# Patient Record
Sex: Female | Born: 1935
Health system: Southern US, Community
[De-identification: ages and names within clinical notes are randomized; demographics above are authoritative.]

## PROBLEM LIST (undated history)

## (undated) DIAGNOSIS — K8020 Calculus of gallbladder without cholecystitis without obstruction: Secondary | ICD-10-CM

## (undated) DIAGNOSIS — E538 Deficiency of other specified B group vitamins: Secondary | ICD-10-CM

## (undated) DIAGNOSIS — I10 Essential (primary) hypertension: Secondary | ICD-10-CM

## (undated) DIAGNOSIS — N390 Urinary tract infection, site not specified: Secondary | ICD-10-CM

## (undated) DIAGNOSIS — N183 Chronic kidney disease, stage 3 unspecified: Secondary | ICD-10-CM

## (undated) DIAGNOSIS — I6529 Occlusion and stenosis of unspecified carotid artery: Secondary | ICD-10-CM

## (undated) DIAGNOSIS — G459 Transient cerebral ischemic attack, unspecified: Secondary | ICD-10-CM

## (undated) DIAGNOSIS — F339 Major depressive disorder, recurrent, unspecified: Secondary | ICD-10-CM

## (undated) DIAGNOSIS — J189 Pneumonia, unspecified organism: Secondary | ICD-10-CM

## (undated) DIAGNOSIS — M1990 Unspecified osteoarthritis, unspecified site: Secondary | ICD-10-CM

## (undated) DIAGNOSIS — A0472 Enterocolitis due to Clostridium difficile, not specified as recurrent: Secondary | ICD-10-CM

## (undated) DIAGNOSIS — E785 Hyperlipidemia, unspecified: Secondary | ICD-10-CM

## (undated) DIAGNOSIS — K219 Gastro-esophageal reflux disease without esophagitis: Secondary | ICD-10-CM

## (undated) DIAGNOSIS — K635 Polyp of colon: Secondary | ICD-10-CM

## (undated) DIAGNOSIS — I129 Hypertensive chronic kidney disease with stage 1 through stage 4 chronic kidney disease, or unspecified chronic kidney disease: Secondary | ICD-10-CM

## (undated) DIAGNOSIS — E559 Vitamin D deficiency, unspecified: Secondary | ICD-10-CM

## (undated) DIAGNOSIS — M8000XA Age-related osteoporosis with current pathological fracture, unspecified site, initial encounter for fracture: Secondary | ICD-10-CM

## (undated) DIAGNOSIS — I739 Peripheral vascular disease, unspecified: Secondary | ICD-10-CM

## (undated) HISTORY — PX: CHOLECYSTECTOMY: SHX55

## (undated) HISTORY — PX: CATARACT EXTRACTION: SUR2

## (undated) HISTORY — DX: Hypertensive chronic kidney disease with stage 1 through stage 4 chronic kidney disease, or unspecified chronic kidney disease: I12.9

## (undated) HISTORY — DX: Urinary tract infection, site not specified: N39.0

## (undated) HISTORY — DX: Major depressive disorder, recurrent, unspecified: F33.9

## (undated) HISTORY — DX: Age-related osteoporosis with current pathological fracture, unspecified site, initial encounter for fracture: M80.00XA

## (undated) HISTORY — DX: Chronic kidney disease, stage 3 unspecified: N18.30

## (undated) HISTORY — DX: Polyp of colon: K63.5

## (undated) HISTORY — PX: ABDOMINAL HYSTERECTOMY: SHX81

## (undated) HISTORY — PX: APPENDECTOMY: SHX54

## (undated) HISTORY — DX: Peripheral vascular disease, unspecified: I73.9

## (undated) HISTORY — PX: BACK SURGERY: SHX140

## (undated) HISTORY — PX: TONSILLECTOMY: SUR1361

## (undated) HISTORY — DX: Calculus of gallbladder without cholecystitis without obstruction: K80.20

## (undated) HISTORY — DX: Hyperlipidemia, unspecified: E78.5

## (undated) HISTORY — DX: Unspecified osteoarthritis, unspecified site: M19.90

## (undated) HISTORY — DX: Deficiency of other specified B group vitamins: E53.8

## (undated) HISTORY — DX: Vitamin D deficiency, unspecified: E55.9

---
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
## 1993-01-07 HISTORY — PX: ABDOMINAL SURGERY: SHX537

## 2000-01-08 HISTORY — PX: CHOLECYSTECTOMY: SHX55

## 2000-01-08 HISTORY — PX: BACK SURGERY: SHX140

## 2000-05-29 ENCOUNTER — Other Ambulatory Visit: Admission: RE | Admit: 2000-05-29 | Discharge: 2000-05-29 | Payer: Self-pay | Admitting: Gynecology

## 2000-11-24 ENCOUNTER — Other Ambulatory Visit: Admission: RE | Admit: 2000-11-24 | Discharge: 2000-11-24 | Payer: Self-pay | Admitting: Gynecology

## 2001-06-16 ENCOUNTER — Other Ambulatory Visit: Admission: RE | Admit: 2001-06-16 | Discharge: 2001-06-16 | Payer: Self-pay | Admitting: Gynecology

## 2002-01-07 DIAGNOSIS — A0472 Enterocolitis due to Clostridium difficile, not specified as recurrent: Secondary | ICD-10-CM

## 2002-01-07 HISTORY — DX: Enterocolitis due to Clostridium difficile, not specified as recurrent: A04.72

## 2010-05-14 ENCOUNTER — Ambulatory Visit (HOSPITAL_COMMUNITY): Payer: Medicare Other

## 2010-05-14 ENCOUNTER — Ambulatory Visit (HOSPITAL_COMMUNITY)
Admission: RE | Admit: 2010-05-14 | Discharge: 2010-05-15 | Disposition: A | Discharge status: Home / Self Care | Payer: Medicare Other | Source: Ambulatory Visit | Attending: Orthopaedic Surgery | Admitting: Orthopaedic Surgery

## 2010-05-14 DIAGNOSIS — K219 Gastro-esophageal reflux disease without esophagitis: Secondary | ICD-10-CM | POA: Insufficient documentation

## 2010-05-14 DIAGNOSIS — I10 Essential (primary) hypertension: Secondary | ICD-10-CM | POA: Insufficient documentation

## 2010-05-14 DIAGNOSIS — Z01810 Encounter for preprocedural cardiovascular examination: Secondary | ICD-10-CM | POA: Insufficient documentation

## 2010-05-14 DIAGNOSIS — Z01812 Encounter for preprocedural laboratory examination: Secondary | ICD-10-CM | POA: Insufficient documentation

## 2010-05-14 DIAGNOSIS — Z01818 Encounter for other preprocedural examination: Secondary | ICD-10-CM | POA: Insufficient documentation

## 2010-05-14 DIAGNOSIS — Z79899 Other long term (current) drug therapy: Secondary | ICD-10-CM | POA: Insufficient documentation

## 2010-05-14 DIAGNOSIS — M5126 Other intervertebral disc displacement, lumbar region: Secondary | ICD-10-CM | POA: Insufficient documentation

## 2010-05-14 LAB — CBC
Hemoglobin: 12.3 g/dL (ref 12.0–15.0)
RBC: 4.17 MIL/uL (ref 3.87–5.11)

## 2010-05-14 LAB — SURGICAL PCR SCREEN: MRSA, PCR: NEGATIVE

## 2010-05-14 LAB — BASIC METABOLIC PANEL
CO2: 32 mEq/L (ref 19–32)
Chloride: 105 mEq/L (ref 96–112)
GFR calc Af Amer: 57 mL/min — ABNORMAL LOW (ref >60)
Sodium: 142 mEq/L (ref 135–145)

## 2010-06-07 NOTE — Op Note (Signed)
  [name], [name]              ACCOUNT NO.:  [id number]  MEDICAL RECORD NO.:  [id number]           PATIENT TYPE:  O  LOCATION:  5032                         FACILITY:  [facility]  PHYSICIAN:  Keaten Mashek C. Ophelia Charter, M.D.    DATE OF BIRTH:  July 13, 1935  DATE OF PROCEDURE:  05/14/2010 DATE OF DISCHARGE:                              OPERATIVE REPORT   PREOPERATIVE DIAGNOSIS:  Left L4-5 foraminal herniated nucleus pulposus.  POSTOPERATIVE DIAGNOSIS:  Left L4-5 foraminal herniated nucleus pulposus.  PROCEDURE:  Left L4-5 microdiskectomy.  SURGEON:  Zarriah Starkel C. Ophelia Charter, MD  ASSISTANT:  Patrick Jupiter RNFA  ESTIMATED BLOOD LOSS:  Minimal.  ANESTHESIA:  GOT plus 9 mL of Marcaine local.  PROCEDURE:  After induction of general anesthesia and orotracheal intubation, preoperative Ancef prophylaxis, intubation, the patient placed prone, careful padding, positioning arms at 90/90, shoulder pads anteriorly and pads over the knee.  Back was prepped with DuraPrep. Care was squared with towels, Betadine.  Steri-Drape applied and laminectomy sheet.  Time-out procedure was completed, needle localization cross-table lateral x-ray showed needle was barely above the 4-5 interspace, slightly cephalad, which was the position for the disk herniation.  An incision was made starting midline about 2 mm left midline centered over the needle area.  Subperiosteal dissection laterally was performed and foraminotomy was performed.  There were some thick chunks of ligaments which were removed.  The disk was bulging, noted laterally starting out at the level of the pedicle.  Annulus was incised with the nerve root gently protected with D'Errico and passes were made with up straight micro pituitary and down micro pituitary.  Using the coronary artery dilator, piece of the disk were teased and Epstein was used laterally underneath some lateral vein pushing down on the disk and then delivering it through the annular incision  by removing chunks of disk until it was decompressed.  Hockey stick could be passed out laterally. Bone was removed at the level of the pedicle, under surface of the facet was undercut getting more room.  Micro pituitary was used to grasp remaining fragments.  Hockey stick was passed anterior to the dura 180 degrees with no areas of compression.  Nerve root was free and copious irrigation was performed.  Repeat checking with Epsteins out laterally above the disk and then just underneath the annulus pushing some additional fragments down and then removing them with the micro pituitary.  Patrick Jupiter, RNFA assisted joint protecting the dura with the D'Errico.  Disk space was irrigated with saline solution. Hemostasis was obtained with bipolar cautery and in the lateral gutter and standard layered closure with 0 Vicryl in the deep fascia, 2-0 on the subcutaneous tissue, and 4-0 subcuticular skin closure.  Tincture of Benzoin and Steri-Strips, Marcaine infiltration with a 25-gauge needle, 4x4s and tape was applied.  Time-out procedure was closed at the end of the case procedure as posted.     Pansey Pinheiro C. Ophelia Charter, M.D.     MCY/MEDQ  D:  05/14/2010  T:  05/15/2010  Job:  [id number]  Electronically Signed by Annell Greening M.D. on 06/07/2010 06:27:14 PM

## 2013-03-13 ENCOUNTER — Emergency Department (HOSPITAL_COMMUNITY)
Admission: EM | Admit: 2013-03-13 | Discharge: 2013-03-13 | Disposition: A | Discharge status: Home / Self Care | Payer: Medicare Other | Attending: Emergency Medicine | Admitting: Emergency Medicine

## 2013-03-13 ENCOUNTER — Emergency Department (HOSPITAL_COMMUNITY): Payer: Medicare Other

## 2013-03-13 ENCOUNTER — Encounter (HOSPITAL_COMMUNITY): Payer: Self-pay | Admitting: Emergency Medicine

## 2013-03-13 DIAGNOSIS — K219 Gastro-esophageal reflux disease without esophagitis: Secondary | ICD-10-CM

## 2013-03-13 DIAGNOSIS — I10 Essential (primary) hypertension: Secondary | ICD-10-CM | POA: Insufficient documentation

## 2013-03-13 DIAGNOSIS — N289 Disorder of kidney and ureter, unspecified: Secondary | ICD-10-CM

## 2013-03-13 DIAGNOSIS — Z880 Allergy status to penicillin: Secondary | ICD-10-CM | POA: Insufficient documentation

## 2013-03-13 HISTORY — DX: Essential (primary) hypertension: I10

## 2013-03-13 LAB — COMPREHENSIVE METABOLIC PANEL
ALK PHOS: 50 U/L (ref 39–117)
ALT: 12 U/L (ref 0–35)
AST: 16 U/L (ref 0–37)
Albumin: 3.1 g/dL — ABNORMAL LOW (ref 3.5–5.2)
BUN: 33 mg/dL — AB (ref 6–23)
CALCIUM: 8.9 mg/dL (ref 8.4–10.5)
CO2: 24 meq/L (ref 19–32)
Chloride: 102 mEq/L (ref 96–112)
Creatinine, Ser: 2.02 mg/dL — ABNORMAL HIGH (ref 0.50–1.10)
GFR, EST AFRICAN AMERICAN: 27 mL/min — AB (ref >90)
GFR, EST NON AFRICAN AMERICAN: 23 mL/min — AB (ref >90)
GLUCOSE: 99 mg/dL (ref 70–99)
Potassium: 3.9 mEq/L (ref 3.7–5.3)
SODIUM: 141 meq/L (ref 137–147)
TOTAL PROTEIN: 6.3 g/dL (ref 6.0–8.3)
Total Bilirubin: 0.3 mg/dL (ref 0.3–1.2)

## 2013-03-13 LAB — CBC
HCT: 34 % — ABNORMAL LOW (ref 36.0–46.0)
Hemoglobin: 11.3 g/dL — ABNORMAL LOW (ref 12.0–15.0)
MCH: 30 pg (ref 26.0–34.0)
MCHC: 33.2 g/dL (ref 30.0–36.0)
MCV: 90.2 fL (ref 78.0–100.0)
PLATELETS: 116 10*3/uL — AB (ref 150–400)
RBC: 3.77 MIL/uL — ABNORMAL LOW (ref 3.87–5.11)
RDW: 14.4 % (ref 11.5–15.5)
WBC: 7.7 10*3/uL (ref 4.0–10.5)

## 2013-03-13 LAB — TROPONIN I: Troponin I: <0.3 ng/mL (ref <0.30)

## 2013-03-13 LAB — I-STAT TROPONIN, ED: TROPONIN I, POC: 0.01 ng/mL (ref 0.00–0.08)

## 2013-03-13 LAB — LIPASE, BLOOD: LIPASE: 49 U/L (ref 11–59)

## 2013-03-13 LAB — PRO B NATRIURETIC PEPTIDE: Pro B Natriuretic peptide (BNP): 317.5 pg/mL (ref 0–450)

## 2013-03-13 MED ORDER — FAMOTIDINE 20 MG PO TABS
40.0000 mg | ORAL_TABLET | Freq: Once | ORAL | Status: AC
  Administered 2013-03-13: 40 mg via ORAL
  Filled 2013-03-13: qty 2

## 2013-03-13 MED ORDER — GI COCKTAIL ~~LOC~~
30.0000 mL | Freq: Once | ORAL | Status: AC
Start: 2013-03-13 — End: 2013-03-13
  Administered 2013-03-13: 30 mL via ORAL
  Filled 2013-03-13: qty 30

## 2013-03-13 NOTE — ED Notes (Signed)
On primary assessment patient had audible gas sounds and belching.

## 2013-03-13 NOTE — Discharge Instructions (Signed)
Call your Dr. and tell him that your creatinine is 2 and this needs to be followed up on next week

## 2013-03-13 NOTE — ED Notes (Signed)
Patient took ASA and alka-seltzer prior to arrival. Patient stated this did give her relief.

## 2013-03-13 NOTE — ED Notes (Signed)
Pt is ambulatory. Discharged with family.

## 2013-03-13 NOTE — ED Provider Notes (Addendum)
CSN: [id number]     Arrival date & time 03/13/13  1931 History   First MD Initiated Contact with Patient 03/13/13 1956     Chief Complaint  Patient presents with  . Chest Pain     (Consider location/radiation/quality/duration/timing/severity/associated sxs/prior Treatment) Patient is a 78 y.o. female presenting with chest pain. The history is provided by the patient and a relative.  Chest Pain  patient here complaining of abdominal discomfort that began approximately 1 hour ago. Described as epigastric heaviness that radiated to her chest. Patient felt as if that she could burp she feel better. She took an antacid had very large belch and feels much better at this time. She has had issues with acid reflux in the past and over the last 2 nights has had increasing symptoms. She denies any anginal type chest pain. No diaphoresis or dyspnea. No leg pain or swelling. No syncope or near-syncope. Denies any prior history of coronary artery disease. She is currently asymptomatic at this time.  Past Medical History  Diagnosis Date  . Hypertension    Past Surgical History  Procedure Laterality Date  . Cholecystectomy    . Appendectomy    . Tonsillectomy     No family history on file. History  Substance Use Topics  . Smoking status: Never Smoker   . Smokeless tobacco: Not on file  . Alcohol Use: No   OB History   Grav Para Term Preterm Abortions TAB SAB Ect Mult Living                 Review of Systems  Cardiovascular: Positive for chest pain.  All other systems reviewed and are negative.      Allergies  Penicillins  Home Medications  No current outpatient prescriptions on file. There were no vitals taken for this visit. Physical Exam  Nursing note and vitals reviewed. Constitutional: She is oriented to person, place, and time. She appears well-developed and well-nourished.  Non-toxic appearance. No distress.  HENT:  Head: Normocephalic and atraumatic.  Eyes: Conjunctivae,  EOM and lids are normal. Pupils are equal, round, and reactive to light.  Neck: Normal range of motion. Neck supple. No tracheal deviation present. No mass present.  Cardiovascular: Normal rate, regular rhythm and normal heart sounds.  Exam reveals no gallop.   No murmur heard. Pulmonary/Chest: Effort normal and breath sounds normal. No stridor. No respiratory distress. She has no decreased breath sounds. She has no wheezes. She has no rhonchi. She has no rales.  Abdominal: Soft. Normal appearance and bowel sounds are normal. She exhibits no distension. There is no tenderness. There is no rebound and no CVA tenderness.  Musculoskeletal: Normal range of motion. She exhibits no edema and no tenderness.  Neurological: She is alert and oriented to person, place, and time. She has normal strength. No cranial nerve deficit or sensory deficit. GCS eye subscore is 4. GCS verbal subscore is 5. GCS motor subscore is 6.  Skin: Skin is warm and dry. No abrasion and no rash noted.  Psychiatric: She has a normal mood and affect. Her speech is normal and behavior is normal.    ED Course  Procedures (including critical care time) Labs Review Labs Reviewed  CBC  TROPONIN I  COMPREHENSIVE METABOLIC PANEL  LIPASE, BLOOD   Imaging Review No results found.   EKG Interpretation   Date/Time:  Saturday March 13 2013 19:37:21 EST Ventricular Rate:  66 PR Interval:  146 QRS Duration: 72 QT Interval:  436 QTC  Calculation: 457 R Axis:   51 Text Interpretation:  Sinus rhythm with Premature atrial complexes with  Abberant conduction Otherwise normal ECG No significant change since last  tracing Confirmed by Meyer Arora  MD, Trynity Skousen ([id number]) on 03/13/2013 8:07:58 PM      MDM   Final diagnoses:  None    Discussed with patient at length concerning her renal function. Patient states that her Dr. Has told her to avoid  all NSAIDs. Suspect that pt has chronic renal insufficiency. Patient will followup with her Dr.  next week concerning her renal function.    Leota Jacobsen, MD 03/13/13 3568  Leota Jacobsen, MD 03/13/13 [phone]

## 2013-03-13 NOTE — ED Notes (Signed)
Cp x 1 hr  With dizziness, shortness of breathe, and pain radiating into back and neck.  No hx of same.

## 2013-03-28 ENCOUNTER — Encounter (HOSPITAL_COMMUNITY): Payer: Self-pay | Admitting: Emergency Medicine

## 2013-03-28 ENCOUNTER — Emergency Department (HOSPITAL_COMMUNITY)
Admission: EM | Admit: 2013-03-28 | Discharge: 2013-03-29 | Disposition: A | Discharge status: Home / Self Care | Payer: Medicare Other | Attending: Emergency Medicine | Admitting: Emergency Medicine

## 2013-03-28 DIAGNOSIS — Z79899 Other long term (current) drug therapy: Secondary | ICD-10-CM | POA: Diagnosis not present

## 2013-03-28 DIAGNOSIS — Z7982 Long term (current) use of aspirin: Secondary | ICD-10-CM | POA: Diagnosis not present

## 2013-03-28 DIAGNOSIS — R112 Nausea with vomiting, unspecified: Secondary | ICD-10-CM | POA: Diagnosis not present

## 2013-03-28 DIAGNOSIS — I10 Essential (primary) hypertension: Secondary | ICD-10-CM | POA: Insufficient documentation

## 2013-03-28 DIAGNOSIS — R42 Dizziness and giddiness: Secondary | ICD-10-CM

## 2013-03-28 DIAGNOSIS — Z8673 Personal history of transient ischemic attack (TIA), and cerebral infarction without residual deficits: Secondary | ICD-10-CM | POA: Diagnosis not present

## 2013-03-28 DIAGNOSIS — Z880 Allergy status to penicillin: Secondary | ICD-10-CM | POA: Diagnosis not present

## 2013-03-28 HISTORY — DX: Occlusion and stenosis of unspecified carotid artery: I65.29

## 2013-03-28 HISTORY — DX: Transient cerebral ischemic attack, unspecified: G45.9

## 2013-03-28 LAB — CBC WITH DIFFERENTIAL/PLATELET
BASOS ABS: 0 10*3/uL (ref 0.0–0.1)
BASOS PCT: 1 % (ref 0–1)
Eosinophils Absolute: 0.1 10*3/uL (ref 0.0–0.7)
Eosinophils Relative: 2 % (ref 0–5)
HCT: 39.5 % (ref 36.0–46.0)
HEMOGLOBIN: 13.1 g/dL (ref 12.0–15.0)
LYMPHS PCT: 23 % (ref 12–46)
Lymphs Abs: 1.2 10*3/uL (ref 0.7–4.0)
MCH: 29.7 pg (ref 26.0–34.0)
MCHC: 33.2 g/dL (ref 30.0–36.0)
MCV: 89.6 fL (ref 78.0–100.0)
MONOS PCT: 9 % (ref 3–12)
Monocytes Absolute: 0.5 10*3/uL (ref 0.1–1.0)
NEUTROS PCT: 65 % (ref 43–77)
Neutro Abs: 3.3 10*3/uL (ref 1.7–7.7)
Platelets: 192 10*3/uL (ref 150–400)
RBC: 4.41 MIL/uL (ref 3.87–5.11)
RDW: 13.9 % (ref 11.5–15.5)
WBC: 5.2 10*3/uL (ref 4.0–10.5)

## 2013-03-28 LAB — URINALYSIS, ROUTINE W REFLEX MICROSCOPIC
Bilirubin Urine: NEGATIVE
Glucose, UA: NEGATIVE mg/dL
Hgb urine dipstick: NEGATIVE
Ketones, ur: NEGATIVE mg/dL
NITRITE: NEGATIVE
PH: 6.5 (ref 5.0–8.0)
Protein, ur: NEGATIVE mg/dL
SPECIFIC GRAVITY, URINE: 1.018 (ref 1.005–1.030)
Urobilinogen, UA: 1 mg/dL (ref 0.0–1.0)

## 2013-03-28 LAB — BASIC METABOLIC PANEL
BUN: 31 mg/dL — ABNORMAL HIGH (ref 6–23)
CHLORIDE: 101 meq/L (ref 96–112)
CO2: 25 mEq/L (ref 19–32)
Calcium: 9.6 mg/dL (ref 8.4–10.5)
Creatinine, Ser: 1.16 mg/dL — ABNORMAL HIGH (ref 0.50–1.10)
GFR calc Af Amer: 52 mL/min — ABNORMAL LOW (ref >90)
GFR calc non Af Amer: 45 mL/min — ABNORMAL LOW (ref >90)
Glucose, Bld: 106 mg/dL — ABNORMAL HIGH (ref 70–99)
POTASSIUM: 3.9 meq/L (ref 3.7–5.3)
Sodium: 139 mEq/L (ref 137–147)

## 2013-03-28 LAB — URINE MICROSCOPIC-ADD ON

## 2013-03-28 MED ORDER — ONDANSETRON 4 MG PO TBDP
8.0000 mg | ORAL_TABLET | Freq: Once | ORAL | Status: AC
  Administered 2013-03-28: 8 mg via ORAL
  Filled 2013-03-28: qty 2

## 2013-03-28 NOTE — ED Provider Notes (Addendum)
CSN: [id number]     Arrival date & time 03/28/13  1935 History   First MD Initiated Contact with Patient 03/28/13 2122     Chief Complaint  Patient presents with  . Hypertension  . Dizziness  . Headache  . Nausea  . Emesis     (Consider location/radiation/quality/duration/timing/severity/associated sxs/prior Treatment) Patient is a 78 y.o. female presenting with hypertension, dizziness, headaches, and vomiting. The history is provided by the patient.  Hypertension Associated symptoms include headaches.  Dizziness Associated symptoms: headaches and vomiting   Headache Associated symptoms: dizziness and vomiting   Emesis Associated symptoms: headaches    Carolyn [name] is a 78 y.o. female she presents for evaluation of hypertension. She has periods of high blood pressure, and weakness that is worse with standing, both of which are ongoing for several weeks. She was evaluated in an outlying ED, 4 days ago. Apparently, at that time, her blood pressure was "over 200 ". At that time an MRI of the head and neck were done and did not reveal any acute abnormalities. She has a list of blood pressures that were done today and yesterday, at home, which varied from approximately 170/92. A low of 107/60. She had some vomiting 4 days ago, but none since. She denies fever, chills, diarrhea, abdominal pain, back pain, persistent, weakness, persistent, dizziness, paresthesias, or inability to walk. She is in the ED, with 2 sons, who are very concerned about her health status. She states that she has some stress, but is not sure what it is. There are no other known modifying factors.   Past Medical History  Diagnosis Date  . Hypertension   . TIA (transient ischemic attack)     "not sure, mentioned in past and recently"  . Carotid artery narrowing    Past Surgical History  Procedure Laterality Date  . Cholecystectomy    . Appendectomy    . Tonsillectomy    . Back surgery     No family history  on file. History  Substance Use Topics  . Smoking status: Never Smoker   . Smokeless tobacco: Not on file  . Alcohol Use: No   OB History   Grav Para Term Preterm Abortions TAB SAB Ect Mult Living                 Review of Systems  Gastrointestinal: Positive for vomiting.  Neurological: Positive for dizziness and headaches.      Allergies  Penicillins  Home Medications   Current Outpatient Rx  Name  Route  Sig  Dispense  Refill  . aspirin EC 81 MG tablet   Oral   Take 81 mg by mouth daily.         . cloNIDine (CATAPRES) 0.1 MG tablet   Oral   Take 0.1 mg by mouth 2 (two) times daily.         Marland Kitchen dicyclomine (BENTYL) 10 MG capsule   Oral   Take 10 mg by mouth 4 (four) times daily as needed for spasms.          . fenofibrate micronized (LOFIBRA) 134 MG capsule   Oral   Take 134 mg by mouth daily before breakfast.         . hydrochlorothiazide (HYDRODIURIL) 25 MG tablet   Oral   Take 25 mg by mouth daily.         Marland Kitchen lisinopril (PRINIVIL,ZESTRIL) 10 MG tablet   Oral   Take 10 mg by mouth daily.         Marland Kitchen  omeprazole (PRILOSEC) 20 MG capsule   Oral   Take 20 mg by mouth daily.         . sodium-potassium bicarbonate (ALKA-SELTZER GOLD) TBEF dissolvable tablet   Oral   Take 2 tablets by mouth daily as needed (for indigestion).          BP 165/55  Pulse 55  Temp(Src) 98.1 F (36.7 C) (Oral)  Resp 16  Ht 5\' 3"  (1.6 m)  Wt 140 lb (63.504 kg)  BMI 24.81 kg/m2  SpO2 97% Physical Exam  Nursing note and vitals reviewed. Constitutional: She is oriented to person, place, and time. She appears well-developed.  Elderly, vigorous  HENT:  Head: Normocephalic and atraumatic.  Eyes: Conjunctivae and EOM are normal. Pupils are equal, round, and reactive to light.  Neck: Normal range of motion and phonation normal. Neck supple.  Cardiovascular: Normal rate, regular rhythm and intact distal pulses.   Pulmonary/Chest: Effort normal and breath sounds  normal. She exhibits no tenderness.  Abdominal: Soft. She exhibits no distension. There is no tenderness. There is no guarding.  Musculoskeletal: Normal range of motion.  Neurological: She is alert and oriented to person, place, and time. She exhibits normal muscle tone.  Skin: Skin is warm and dry.  Psychiatric: She has a normal mood and affect. Her behavior is normal. Judgment and thought content normal.    ED Course  Procedures (including critical care time)  BP at 22:25- 153/55   Medications  ondansetron (ZOFRAN-ODT) disintegrating tablet 8 mg (8 mg Oral Given 03/28/13 2013)    Patient Vitals for the past 24 hrs:  BP Temp Pulse Resp SpO2  03/28/13 2330 165/55 mmHg - 55 - 97 %  03/28/13 2300 167/64 mmHg - 58 - 96 %  03/28/13 2244 162/59 mmHg - 50 16 100 %  03/28/13 2230 153/53 mmHg - 57 - 95 %  03/28/13 2145 168/58 mmHg - 56 - 96 %  03/28/13 2122 - 98.1 F (36.7 C) - - -  03/28/13 2114 172/61 mmHg - 50 15 97 %    At D/C Reevaluation with update and discussion. After initial assessment and treatment, an updated evaluation reveals calm and comfortable with reassuring BP. Findings discussed with Pt. And Family, all questions answered.Daleen Bo L    Labs Review Labs Reviewed  BASIC METABOLIC PANEL - Abnormal; Notable for the following:    Glucose, Bld 106 (*)    BUN 31 (*)    Creatinine, Ser 1.16 (*)    GFR calc non Af Amer 45 (*)    GFR calc Af Amer 52 (*)    All other components within normal limits  URINALYSIS, ROUTINE W REFLEX MICROSCOPIC - Abnormal; Notable for the following:    Leukocytes, UA SMALL (*)    All other components within normal limits  URINE MICROSCOPIC-ADD ON - Abnormal; Notable for the following:    Bacteria, UA FEW (*)    Casts HYALINE CASTS (*)    All other components within normal limits  CBC WITH DIFFERENTIAL     MDM   Final diagnoses:  Hypertension  Dizziness   Mild HTN with nonspecific dizziness. Doubt hypertensive urgency, CVA,  metabolic instability or SBI.   Nursing Notes Reviewed/ Care Coordinated Applicable Imaging Reviewed Interpretation of Laboratory Data incorporated into ED treatment  The patient appears reasonably screened and/or stabilized for discharge and I doubt any other medical condition or other [facility] requiring further screening, evaluation, or treatment in the ED at this time prior to discharge.  Plan: Home Medications- Stop Clonadine; Home Treatments- rest; return here if the recommended treatment, does not improve the symptoms; Recommended follow up- PCP for check up in 3-4 days     Richarda Blade, MD 03/29/13 2104  Richarda Blade, MD 04/06/13 2351

## 2013-03-28 NOTE — Discharge Instructions (Signed)
Hypertension As your heart beats, it forces blood through your arteries. This force is your blood pressure. If the pressure is too high, it is called hypertension (HTN) or high blood pressure. HTN is dangerous because you may have it and not know it. High blood pressure may mean that your heart has to work harder to pump blood. Your arteries may be narrow or stiff. The extra work puts you at risk for heart disease, stroke, and other problems.  Blood pressure consists of two numbers, a higher number over a lower, 110/72, for example. It is stated as "110 over 72." The ideal is below 120 for the top number (systolic) and under 80 for the bottom (diastolic). Write down your blood pressure today. You should pay close attention to your blood pressure if you have certain conditions such as:  Heart failure.  Prior heart attack.  Diabetes  Chronic kidney disease.  Prior stroke.  Multiple risk factors for heart disease. To see if you have HTN, your blood pressure should be measured while you are seated with your arm held at the level of the heart. It should be measured at least twice. A one-time elevated blood pressure reading (especially in the Emergency Department) does not mean that you need treatment. There may be conditions in which the blood pressure is different between your right and left arms. It is important to see your caregiver soon for a recheck. Most people have essential hypertension which means that there is not a specific cause. This type of high blood pressure may be lowered by changing lifestyle factors such as:  Stress.  Smoking.  Lack of exercise.  Excessive weight.  Drug/tobacco/alcohol use.  Eating less salt. Most people do not have symptoms from high blood pressure until it has caused damage to the body. Effective treatment can often prevent, delay or reduce that damage. TREATMENT  When a cause has been identified, treatment for high blood pressure is directed at the  cause. There are a large number of medications to treat HTN. These fall into several categories, and your caregiver will help you select the medicines that are best for you. Medications may have side effects. You should review side effects with your caregiver. If your blood pressure stays high after you have made lifestyle changes or started on medicines,   Your medication(s) may need to be changed.  Other problems may need to be addressed.  Be certain you understand your prescriptions, and know how and when to take your medicine.  Be sure to follow up with your caregiver within the time frame advised (usually within two weeks) to have your blood pressure rechecked and to review your medications.  If you are taking more than one medicine to lower your blood pressure, make sure you know how and at what times they should be taken. Taking two medicines at the same time can result in blood pressure that is too low. SEEK IMMEDIATE MEDICAL CARE IF:  You develop a severe headache, blurred or changing vision, or confusion.  You have unusual weakness or numbness, or a faint feeling.  You have severe chest or abdominal pain, vomiting, or breathing problems. MAKE SURE YOU:   Understand these instructions.  Will watch your condition.  Will get help right away if you are not doing well or get worse. Document Released: 12/24/2004 Document Revised: 03/18/2011 Document Reviewed: 08/14/2007 [facility] Patient Information 2014 [place].  Vertigo Vertigo means you feel like you or your surroundings are moving when they are  not. Vertigo can be dangerous if it occurs when you are at work, driving, or performing difficult activities.  CAUSES  Vertigo occurs when there is a conflict of signals sent to your brain from the visual and sensory systems in your body. There are many different causes of vertigo, including:  Infections, especially in the inner ear.  A bad reaction to a drug or misuse of  alcohol and medicines.  Withdrawal from drugs or alcohol.  Rapidly changing positions, such as lying down or rolling over in bed.  A migraine headache.  Decreased blood flow to the brain.  Increased pressure in the brain from a head injury, infection, tumor, or bleeding. SYMPTOMS  You may feel as though the world is spinning around or you are falling to the ground. Because your balance is upset, vertigo can cause nausea and vomiting. You may have involuntary eye movements (nystagmus). DIAGNOSIS  Vertigo is usually diagnosed by physical exam. If the cause of your vertigo is unknown, your caregiver may perform imaging tests, such as an MRI scan (magnetic resonance imaging). TREATMENT  Most cases of vertigo resolve on their own, without treatment. Depending on the cause, your caregiver may prescribe certain medicines. If your vertigo is related to body position issues, your caregiver may recommend movements or procedures to correct the problem. In rare cases, if your vertigo is caused by certain inner ear problems, you may need surgery. HOME CARE INSTRUCTIONS   Follow your caregiver's instructions.  Avoid driving.  Avoid operating heavy machinery.  Avoid performing any tasks that would be dangerous to you or others during a vertigo episode.  Tell your caregiver if you notice that certain medicines seem to be causing your vertigo. Some of the medicines used to treat vertigo episodes can actually make them worse in some people. SEEK IMMEDIATE MEDICAL CARE IF:   Your medicines do not relieve your vertigo or are making it worse.  You develop problems with talking, walking, weakness, or using your arms, hands, or legs.  You develop severe headaches.  Your nausea or vomiting continues or gets worse.  You develop visual changes.  A family member notices behavioral changes.  Your condition gets worse. MAKE SURE YOU:  Understand these instructions.  Will watch your  condition.  Will get help right away if you are not doing well or get worse. Document Released: 10/03/2004 Document Revised: 03/18/2011 Document Reviewed: 07/12/2010 [facility] Patient Information 2014 [place].

## 2013-03-28 NOTE — ED Notes (Signed)
Pt very "wobbly" and cannot walk without holding on to something; pt got dizzy while ambulating and was returned back to room after approximately 50 feet

## 2013-03-28 NOTE — ED Notes (Signed)
Pt's son request to talk the MD, Dr. Eulis Foster made aware

## 2013-03-28 NOTE — ED Notes (Addendum)
C/o high BP. Also reports sx of: HA, dizziness, nv, some intermittent confusion when BP is really high, also "staggering". Sx ongoing for > 1 month. Sx fluctuate, but never go away. Relates sx to BP. Has been seen at [facility]. Has paperwork in hand. Recent MRIs. Mentions blockages in carotid arteries. D/c'd from ED on 3/19. Seen by PCP on 3/20. New BP meds started. Denies: CP or SOB. Pt had zofran, lisinopril, ativan and clonidine PTA. Some vomiting in triage. Family x2 with pt.

## 2013-03-29 NOTE — ED Notes (Signed)
Pt and family request from this RN to step out for the pt get dress, RN step out the room.

## 2014-04-06 DIAGNOSIS — Z0000 Encounter for general adult medical examination without abnormal findings: Secondary | ICD-10-CM | POA: Diagnosis not present

## 2014-04-06 DIAGNOSIS — G4700 Insomnia, unspecified: Secondary | ICD-10-CM | POA: Diagnosis not present

## 2014-04-06 DIAGNOSIS — I10 Essential (primary) hypertension: Secondary | ICD-10-CM | POA: Diagnosis not present

## 2014-04-06 DIAGNOSIS — Z23 Encounter for immunization: Secondary | ICD-10-CM | POA: Diagnosis not present

## 2014-04-06 DIAGNOSIS — N183 Chronic kidney disease, stage 3 (moderate): Secondary | ICD-10-CM | POA: Diagnosis not present

## 2014-04-06 DIAGNOSIS — E780 Pure hypercholesterolemia: Secondary | ICD-10-CM | POA: Diagnosis not present

## 2014-05-19 DIAGNOSIS — L578 Other skin changes due to chronic exposure to nonionizing radiation: Secondary | ICD-10-CM | POA: Diagnosis not present

## 2014-05-19 DIAGNOSIS — L570 Actinic keratosis: Secondary | ICD-10-CM | POA: Diagnosis not present

## 2014-05-19 DIAGNOSIS — C44729 Squamous cell carcinoma of skin of left lower limb, including hip: Secondary | ICD-10-CM | POA: Diagnosis not present

## 2014-05-24 DIAGNOSIS — C44729 Squamous cell carcinoma of skin of left lower limb, including hip: Secondary | ICD-10-CM | POA: Diagnosis not present

## 2014-05-31 DIAGNOSIS — L820 Inflamed seborrheic keratosis: Secondary | ICD-10-CM | POA: Diagnosis not present

## 2014-05-31 DIAGNOSIS — L570 Actinic keratosis: Secondary | ICD-10-CM | POA: Diagnosis not present

## 2014-05-31 DIAGNOSIS — L728 Other follicular cysts of the skin and subcutaneous tissue: Secondary | ICD-10-CM | POA: Diagnosis not present

## 2014-05-31 DIAGNOSIS — L821 Other seborrheic keratosis: Secondary | ICD-10-CM | POA: Diagnosis not present

## 2014-08-08 DIAGNOSIS — Z79899 Other long term (current) drug therapy: Secondary | ICD-10-CM | POA: Diagnosis not present

## 2014-08-08 DIAGNOSIS — D539 Nutritional anemia, unspecified: Secondary | ICD-10-CM | POA: Diagnosis not present

## 2014-08-08 DIAGNOSIS — E780 Pure hypercholesterolemia: Secondary | ICD-10-CM | POA: Diagnosis not present

## 2014-08-08 DIAGNOSIS — R5383 Other fatigue: Secondary | ICD-10-CM | POA: Diagnosis not present

## 2014-08-08 DIAGNOSIS — D649 Anemia, unspecified: Secondary | ICD-10-CM | POA: Diagnosis not present

## 2014-08-15 DIAGNOSIS — D510 Vitamin B12 deficiency anemia due to intrinsic factor deficiency: Secondary | ICD-10-CM | POA: Diagnosis not present

## 2014-08-22 DIAGNOSIS — R5383 Other fatigue: Secondary | ICD-10-CM | POA: Diagnosis not present

## 2014-08-29 DIAGNOSIS — D649 Anemia, unspecified: Secondary | ICD-10-CM | POA: Diagnosis not present

## 2014-09-06 DIAGNOSIS — R5383 Other fatigue: Secondary | ICD-10-CM | POA: Diagnosis not present

## 2014-10-11 DIAGNOSIS — L570 Actinic keratosis: Secondary | ICD-10-CM | POA: Diagnosis not present

## 2014-10-11 DIAGNOSIS — C44729 Squamous cell carcinoma of skin of left lower limb, including hip: Secondary | ICD-10-CM | POA: Diagnosis not present

## 2014-10-20 DIAGNOSIS — Z23 Encounter for immunization: Secondary | ICD-10-CM | POA: Diagnosis not present

## 2014-11-01 DIAGNOSIS — Z1231 Encounter for screening mammogram for malignant neoplasm of breast: Secondary | ICD-10-CM | POA: Diagnosis not present

## 2015-02-20 DIAGNOSIS — H43813 Vitreous degeneration, bilateral: Secondary | ICD-10-CM | POA: Diagnosis not present

## 2015-04-07 DIAGNOSIS — Z0000 Encounter for general adult medical examination without abnormal findings: Secondary | ICD-10-CM | POA: Diagnosis not present

## 2015-04-07 DIAGNOSIS — G4700 Insomnia, unspecified: Secondary | ICD-10-CM | POA: Diagnosis not present

## 2015-04-07 DIAGNOSIS — N183 Chronic kidney disease, stage 3 (moderate): Secondary | ICD-10-CM | POA: Diagnosis not present

## 2015-04-07 DIAGNOSIS — E785 Hyperlipidemia, unspecified: Secondary | ICD-10-CM | POA: Diagnosis not present

## 2015-04-07 DIAGNOSIS — I10 Essential (primary) hypertension: Secondary | ICD-10-CM | POA: Diagnosis not present

## 2015-04-07 DIAGNOSIS — Z79899 Other long term (current) drug therapy: Secondary | ICD-10-CM | POA: Diagnosis not present

## 2015-06-21 DIAGNOSIS — C44729 Squamous cell carcinoma of skin of left lower limb, including hip: Secondary | ICD-10-CM | POA: Diagnosis not present

## 2015-06-21 DIAGNOSIS — L821 Other seborrheic keratosis: Secondary | ICD-10-CM | POA: Diagnosis not present

## 2015-06-21 DIAGNOSIS — L570 Actinic keratosis: Secondary | ICD-10-CM | POA: Diagnosis not present

## 2015-06-21 DIAGNOSIS — L578 Other skin changes due to chronic exposure to nonionizing radiation: Secondary | ICD-10-CM | POA: Diagnosis not present

## 2015-07-12 DIAGNOSIS — I10 Essential (primary) hypertension: Secondary | ICD-10-CM | POA: Diagnosis not present

## 2015-07-12 DIAGNOSIS — I8390 Asymptomatic varicose veins of unspecified lower extremity: Secondary | ICD-10-CM | POA: Diagnosis not present

## 2015-08-21 DIAGNOSIS — C44729 Squamous cell carcinoma of skin of left lower limb, including hip: Secondary | ICD-10-CM | POA: Diagnosis not present

## 2015-08-21 DIAGNOSIS — L570 Actinic keratosis: Secondary | ICD-10-CM | POA: Diagnosis not present

## 2015-08-21 DIAGNOSIS — L820 Inflamed seborrheic keratosis: Secondary | ICD-10-CM | POA: Diagnosis not present

## 2015-08-28 DIAGNOSIS — I83892 Varicose veins of left lower extremities with other complications: Secondary | ICD-10-CM | POA: Diagnosis not present

## 2015-08-28 DIAGNOSIS — Z9181 History of falling: Secondary | ICD-10-CM | POA: Diagnosis not present

## 2015-09-07 ENCOUNTER — Other Ambulatory Visit: Payer: Self-pay | Admitting: Vascular Surgery

## 2015-09-13 DIAGNOSIS — I6521 Occlusion and stenosis of right carotid artery: Secondary | ICD-10-CM | POA: Diagnosis not present

## 2015-09-13 DIAGNOSIS — I10 Essential (primary) hypertension: Secondary | ICD-10-CM | POA: Diagnosis not present

## 2015-09-22 DIAGNOSIS — E785 Hyperlipidemia, unspecified: Secondary | ICD-10-CM | POA: Diagnosis not present

## 2015-09-22 DIAGNOSIS — Z23 Encounter for immunization: Secondary | ICD-10-CM | POA: Diagnosis not present

## 2015-09-22 DIAGNOSIS — Z0000 Encounter for general adult medical examination without abnormal findings: Secondary | ICD-10-CM | POA: Diagnosis not present

## 2015-09-22 DIAGNOSIS — Z79899 Other long term (current) drug therapy: Secondary | ICD-10-CM | POA: Diagnosis not present

## 2015-10-27 ENCOUNTER — Encounter: Payer: Self-pay | Admitting: Vascular Surgery

## 2015-10-31 ENCOUNTER — Ambulatory Visit (INDEPENDENT_AMBULATORY_CARE_PROVIDER_SITE_OTHER): Payer: Medicare Other | Admitting: Vascular Surgery

## 2015-10-31 ENCOUNTER — Encounter: Payer: Self-pay | Admitting: Vascular Surgery

## 2015-10-31 VITALS — BP 163/73 | HR 54 | Temp 97.1°F | Resp 14 | Ht 63.0 in | Wt 143.0 lb

## 2015-10-31 DIAGNOSIS — I83892 Varicose veins of left lower extremities with other complications: Secondary | ICD-10-CM | POA: Diagnosis not present

## 2015-10-31 HISTORY — DX: Varicose veins of left lower extremity with other complications: I83.892
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
## 2015-11-01 ENCOUNTER — Encounter: Payer: Self-pay | Admitting: Family Medicine

## 2015-11-03 DIAGNOSIS — Z1231 Encounter for screening mammogram for malignant neoplasm of breast: Secondary | ICD-10-CM | POA: Diagnosis not present

## 2015-11-17 ENCOUNTER — Other Ambulatory Visit: Payer: Self-pay | Admitting: *Deleted

## 2015-11-17 DIAGNOSIS — I8390 Asymptomatic varicose veins of unspecified lower extremity: Secondary | ICD-10-CM

## 2015-11-17 DIAGNOSIS — I6523 Occlusion and stenosis of bilateral carotid arteries: Secondary | ICD-10-CM

## 2015-11-17 DIAGNOSIS — I8392 Asymptomatic varicose veins of left lower extremity: Secondary | ICD-10-CM

## 2015-11-21 ENCOUNTER — Ambulatory Visit (INDEPENDENT_AMBULATORY_CARE_PROVIDER_SITE_OTHER)
Admission: RE | Admit: 2015-11-21 | Discharge: 2015-11-21 | Disposition: A | Discharge status: Home / Self Care | Payer: Medicare Other | Source: Ambulatory Visit | Attending: Vascular Surgery | Admitting: Vascular Surgery

## 2015-11-21 ENCOUNTER — Ambulatory Visit (HOSPITAL_COMMUNITY)
Admission: RE | Admit: 2015-11-21 | Discharge: 2015-11-21 | Disposition: A | Discharge status: Home / Self Care | Payer: Medicare Other | Source: Ambulatory Visit | Attending: Vascular Surgery | Admitting: Vascular Surgery

## 2015-11-21 DIAGNOSIS — I8390 Asymptomatic varicose veins of unspecified lower extremity: Secondary | ICD-10-CM | POA: Insufficient documentation

## 2015-11-21 DIAGNOSIS — I6523 Occlusion and stenosis of bilateral carotid arteries: Secondary | ICD-10-CM | POA: Diagnosis not present

## 2015-11-21 LAB — VAS US CAROTID
LCCAPSYS: 83 cm/s
LEFT ECA DIAS: -10 cm/s
LEFT VERTEBRAL DIAS: -11 cm/s
LICAPSYS: -96 cm/s
Left CCA dist dias: 16 cm/s
Left CCA dist sys: 75 cm/s
Left CCA prox dias: 14 cm/s
Left ICA prox dias: -18 cm/s
RCCAPDIAS: 6 cm/s
RIGHT CCA MID DIAS: 9 cm/s
RIGHT ECA DIAS: -14 cm/s
RIGHT VERTEBRAL DIAS: -9 cm/s
Right CCA prox sys: 65 cm/s

## 2015-11-22 ENCOUNTER — Encounter: Payer: Self-pay | Admitting: Vascular Surgery

## 2015-11-27 ENCOUNTER — Ambulatory Visit (INDEPENDENT_AMBULATORY_CARE_PROVIDER_SITE_OTHER): Payer: Medicare Other | Admitting: Vascular Surgery

## 2015-11-27 ENCOUNTER — Encounter: Payer: Self-pay | Admitting: Vascular Surgery

## 2015-11-27 VITALS — BP 146/65 | HR 57 | Temp 97.4°F | Resp 16 | Ht 64.0 in | Wt 145.0 lb

## 2015-11-27 DIAGNOSIS — I83892 Varicose veins of left lower extremities with other complications: Secondary | ICD-10-CM

## 2015-11-27 DIAGNOSIS — I6521 Occlusion and stenosis of right carotid artery: Secondary | ICD-10-CM | POA: Insufficient documentation

## 2015-11-27 HISTORY — DX: Occlusion and stenosis of right carotid artery: I65.21

## 2015-11-27 NOTE — Progress Notes (Signed)
Subjective:     Patient ID: Carolyn [name], female   DOB: December 22, 1935, 80 y.o.   MRN: [id number]  HPI This 80 year old female returns for continued follow-up regarding her varicose veins and pain in the left leg. She has tried long leg elastic compression stockings but continues to have aching and throbbing discomfort as well as swelling in the left ankle as the day progresses. Her other problem which was discovered at her last visit 3 weeks ago was an episode of transient blindness in the right eye as well as a high-pitched right carotid bruit on physical exam. She had a history of a previous study showing some mild to moderate carotid stenosis on the right a few years ago which had never been treated. She returns today for further follow-up regarding this problem as well. She denies any further neurologic symptoms such as other episodes of amaurosis fugax. She's had no transient weakness in the left arm leg and has had no speech abnormalities.  Past Medical History:  Diagnosis Date  . Carotid artery narrowing   . Hypertension   . TIA (transient ischemic attack)    "not sure, mentioned in past and recently"    Social History  Substance Use Topics  . Smoking status: Never Smoker  . Smokeless tobacco: Never Used  . Alcohol use No    No family history on file.  Allergies  Allergen Reactions  . Penicillins Hives     Current Outpatient Prescriptions:  .  aspirin EC 81 MG tablet, Take 81 mg by mouth daily., Disp: , Rfl:  .  cloNIDine (CATAPRES) 0.1 MG tablet, Take 0.1 mg by mouth 2 (two) times daily., Disp: , Rfl:  .  dicyclomine (BENTYL) 10 MG capsule, Take 10 mg by mouth 4 (four) times daily as needed for spasms. , Disp: , Rfl:  .  fenofibrate micronized (LOFIBRA) 134 MG capsule, Take 134 mg by mouth daily before breakfast., Disp: , Rfl:  .  hydrochlorothiazide (HYDRODIURIL) 25 MG tablet, Take 25 mg by mouth daily., Disp: , Rfl:  .  lisinopril (PRINIVIL,ZESTRIL) 10 MG tablet, Take 10  mg by mouth daily., Disp: , Rfl:  .  omeprazole (PRILOSEC) 20 MG capsule, Take 20 mg by mouth daily., Disp: , Rfl:  .  sodium-potassium bicarbonate (ALKA-SELTZER GOLD) TBEF dissolvable tablet, Take 2 tablets by mouth daily as needed (for indigestion)., Disp: , Rfl:   Vitals:   11/27/15 1425  BP: (!) 146/65  Pulse: (!) 57  Resp: 16  Temp: 97.4 F (36.3 C)  SpO2: 96%  Weight: 145 lb (65.8 kg)  Height: 5\' 4"  (1.626 m)    Body mass index is 24.89 kg/m.         Review of Systems   denies chest pain, dyspnea on exertion, PND, orthopnea, previous cardiac events, smoking, previous stroke.  Objective:   Physical Exam BP (!) 146/65 (BP Location: Left Arm, Patient Position: Sitting, Cuff Size: Normal)   Pulse (!) 57   Temp 97.4 F (36.3 C)   Resp 16   Ht 5\' 4"  (1.626 m)   Wt 145 lb (65.8 kg)   SpO2 96%   BMI 24.89 kg/m   Gen. well-developed well-nourished female no apparent distress alert and oriented 3 Right neck with 3+ carotid pulse palpable in high-pitched bruit Neurologic exam normal Left leg with bulging varicosities medial thigh and calf with 1+ distal edema and 2+ dorsalis pedis pulse palpable  Previous venous duplex exam performed 11/21/2015 reveals gross reflux in left great  saphenous vein as well as very severe right ICA stenosis on carotid duplex exam with velocities of 521/131 in the proximal right internal carotid     Assessment:     #1 amaurosis fugax right eye with high-grade right internal carotid stenosis #2 painful varicosities left leg with distal swelling due to gross reflux left great saphenous vein     Plan:     Patient will return tomorrow to see Dr. early to discuss right carotid endarterectomy She will return in early November 2018 for 3 month follow-up regarding her venous disease in the left leg Patient will continue to take daily aspirin and will notify us if she has any new neurologic symptoms prior to appointment tomorrow

## 2015-11-28 ENCOUNTER — Encounter: Payer: Self-pay | Admitting: Vascular Surgery

## 2015-11-28 ENCOUNTER — Other Ambulatory Visit: Payer: Self-pay

## 2015-11-28 ENCOUNTER — Ambulatory Visit (INDEPENDENT_AMBULATORY_CARE_PROVIDER_SITE_OTHER): Payer: BC Managed Care – PPO | Admitting: Vascular Surgery

## 2015-11-28 VITALS — BP 167/66 | HR 56 | Temp 97.8°F | Resp 18 | Ht 64.0 in | Wt 145.0 lb

## 2015-11-28 DIAGNOSIS — I6521 Occlusion and stenosis of right carotid artery: Secondary | ICD-10-CM

## 2015-11-28 NOTE — Progress Notes (Signed)
[facility] [place]  Patient name: Carolyn [name] MRN: [id number] DOB: 04-03-1935 Sex: female  REASON FOR VISIT: Discuss symptomatic right carotid stenosis  HPI: Carolyn [name] is a 80 y.o. female ear today for discussion of symptomatic right carotid stenosis. She's been seen by Dr. Kellie Simmering who obtained a carotid duplex. She had an episode several weeks ago very clear-cut total blindness in her right eye which was temporary and resolved completely. She is right-handed. She did not have any left body symptoms. Had no prior episodes of visual changes or weakness. She is quite healthy. She is here today with her daughter-in-law who is a Marine scientist at [employer]. No prior cardiac difficulties.  Past Medical History:  Diagnosis Date  . Carotid artery narrowing   . Hypertension   . TIA (transient ischemic attack)    "not sure, mentioned in past and recently"    History reviewed. No pertinent family history.  SOCIAL HISTORY: Social History  Substance Use Topics  . Smoking status: Never Smoker  . Smokeless tobacco: Never Used  . Alcohol use No    Allergies  Allergen Reactions  . Penicillins Hives    Current Outpatient Prescriptions  Medication Sig Dispense Refill  . aspirin EC 81 MG tablet Take 81 mg by mouth daily.    . cloNIDine (CATAPRES) 0.1 MG tablet Take 0.1 mg by mouth 2 (two) times daily.    Marland Kitchen dicyclomine (BENTYL) 10 MG capsule Take 10 mg by mouth 4 (four) times daily as needed for spasms.     . fenofibrate micronized (LOFIBRA) 134 MG capsule Take 134 mg by mouth daily before breakfast.    . hydrochlorothiazide (HYDRODIURIL) 25 MG tablet Take 25 mg by mouth daily.    Marland Kitchen lisinopril (PRINIVIL,ZESTRIL) 10 MG tablet Take 10 mg by mouth daily.    Marland Kitchen omeprazole (PRILOSEC) 20 MG capsule Take 20 mg by mouth daily.    . sodium-potassium bicarbonate (ALKA-SELTZER GOLD) TBEF dissolvable tablet Take 2 tablets by mouth daily  as needed (for indigestion).     No current facility-administered medications for this visit.     REVIEW OF SYSTEMS:  [X]  denotes positive finding, [ ]  denotes negative finding Cardiac  Comments:  Chest pain or chest pressure:    Shortness of breath upon exertion:    Short of breath when lying flat:    Irregular heart rhythm:        Vascular    Pain in calf, thigh, or hip brought on by ambulation:    Pain in feet at night that wakes you up from your sleep:     Blood clot in your veins:    Leg swelling:           PHYSICAL EXAM: Vitals:   11/28/15 0836 11/28/15 0838  BP: (!) 163/68 (!) 167/66  Pulse: (!) 56   Resp: 18   Temp: 97.8 F (36.6 C)   TempSrc: Oral   SpO2: 96%   Weight: 145 lb (65.8 kg)   Height: 5\' 4"  (1.626 m)     GENERAL: The patient is a well-nourished female, in no acute distress. The vital signs are documented above. CARDIOVASCULAR: Does have a harsh right carotid bruit and no bruit on the left. Carotid pulsation on the right is below the angle of the jaw. She does have 2+ radial pulses. PULMONARY: There is good air exchange  MUSCULOSKELETAL: There are no major deformities or cyanosis. NEUROLOGIC: No focal weakness or paresthesias are detected. SKIN:  There are no ulcers or rashes noted. PSYCHIATRIC: The patient has a normal affect.  DATA:  I reviewed her duplex showing a critical stenosis in her right internal carotid arterystenosis in her left carotid.  MEDICAL ISSUES: Had long discussion with the patient and her daughter-in-law. Explain the significance of her high-grade stenosis with probable resultant amaurosis fugax. Explain the significant risk of stroke with symptomatic right carotid stenosis. I have recommended right carotid endarterectomy for reduction of her stroke risk. I explained the procedure with a 1 night expected hospitalization. I did explain the 1-2% risk of stroke with surgery and also the very low risk of cranial nerve injury  associated with surgery. She understands and wishes to proceed we will schedule this within the next 1-2 weeks.    Rosetta Posner, MD FACS [facility] Tel [phone] Pager [phone]

## 2015-12-12 ENCOUNTER — Other Ambulatory Visit: Payer: Self-pay

## 2015-12-12 ENCOUNTER — Encounter (HOSPITAL_COMMUNITY): Payer: Self-pay

## 2015-12-12 ENCOUNTER — Encounter (HOSPITAL_COMMUNITY)
Admission: RE | Admit: 2015-12-12 | Discharge: 2015-12-12 | Disposition: A | Discharge status: Home / Self Care | Payer: Medicare Other | Source: Ambulatory Visit | Attending: Vascular Surgery | Admitting: Vascular Surgery

## 2015-12-12 ENCOUNTER — Encounter (HOSPITAL_COMMUNITY): Payer: Self-pay | Admitting: Anesthesiology

## 2015-12-12 DIAGNOSIS — I6523 Occlusion and stenosis of bilateral carotid arteries: Secondary | ICD-10-CM | POA: Insufficient documentation

## 2015-12-12 DIAGNOSIS — K219 Gastro-esophageal reflux disease without esophagitis: Secondary | ICD-10-CM | POA: Diagnosis not present

## 2015-12-12 DIAGNOSIS — I6521 Occlusion and stenosis of right carotid artery: Secondary | ICD-10-CM | POA: Diagnosis not present

## 2015-12-12 DIAGNOSIS — Z880 Allergy status to penicillin: Secondary | ICD-10-CM | POA: Diagnosis not present

## 2015-12-12 DIAGNOSIS — Z79899 Other long term (current) drug therapy: Secondary | ICD-10-CM | POA: Diagnosis not present

## 2015-12-12 DIAGNOSIS — Z01812 Encounter for preprocedural laboratory examination: Secondary | ICD-10-CM | POA: Insufficient documentation

## 2015-12-12 DIAGNOSIS — Z7982 Long term (current) use of aspirin: Secondary | ICD-10-CM | POA: Diagnosis not present

## 2015-12-12 DIAGNOSIS — I10 Essential (primary) hypertension: Secondary | ICD-10-CM | POA: Diagnosis not present

## 2015-12-12 DIAGNOSIS — Z8673 Personal history of transient ischemic attack (TIA), and cerebral infarction without residual deficits: Secondary | ICD-10-CM | POA: Diagnosis not present

## 2015-12-12 HISTORY — DX: Gastro-esophageal reflux disease without esophagitis: K21.9

## 2015-12-12 HISTORY — DX: Pneumonia, unspecified organism: J18.9

## 2015-12-12 HISTORY — DX: Enterocolitis due to Clostridium difficile, not specified as recurrent: A04.72

## 2015-12-12 LAB — PROTIME-INR
INR: 1.12
Prothrombin Time: 14.4 seconds (ref 11.4–15.2)

## 2015-12-12 LAB — URINALYSIS, ROUTINE W REFLEX MICROSCOPIC
Bacteria, UA: NONE SEEN
Bilirubin Urine: NEGATIVE
Glucose, UA: NEGATIVE mg/dL
Hgb urine dipstick: NEGATIVE
Ketones, ur: NEGATIVE mg/dL
Nitrite: NEGATIVE
PH: 6 (ref 5.0–8.0)
Protein, ur: NEGATIVE mg/dL
SPECIFIC GRAVITY, URINE: 1.017 (ref 1.005–1.030)

## 2015-12-12 LAB — COMPREHENSIVE METABOLIC PANEL
ALBUMIN: 3.8 g/dL (ref 3.5–5.0)
ALK PHOS: 51 U/L (ref 38–126)
ALT: 16 U/L (ref 14–54)
ANION GAP: 5 (ref 5–15)
AST: 22 U/L (ref 15–41)
BUN: 30 mg/dL — ABNORMAL HIGH (ref 6–20)
CALCIUM: 9.7 mg/dL (ref 8.9–10.3)
CO2: 30 mmol/L (ref 22–32)
Chloride: 106 mmol/L (ref 101–111)
Creatinine, Ser: 1.41 mg/dL — ABNORMAL HIGH (ref 0.44–1.00)
GFR calc Af Amer: 40 mL/min — ABNORMAL LOW (ref >60)
GFR calc non Af Amer: 35 mL/min — ABNORMAL LOW (ref >60)
GLUCOSE: 109 mg/dL — AB (ref 65–99)
POTASSIUM: 3.8 mmol/L (ref 3.5–5.1)
SODIUM: 141 mmol/L (ref 135–145)
Total Bilirubin: 0.6 mg/dL (ref 0.3–1.2)
Total Protein: 6.5 g/dL (ref 6.5–8.1)

## 2015-12-12 LAB — CBC
HCT: 39.5 % (ref 36.0–46.0)
HEMOGLOBIN: 12.9 g/dL (ref 12.0–15.0)
MCH: 29.7 pg (ref 26.0–34.0)
MCHC: 32.7 g/dL (ref 30.0–36.0)
MCV: 90.8 fL (ref 78.0–100.0)
Platelets: 170 10*3/uL (ref 150–400)
RBC: 4.35 MIL/uL (ref 3.87–5.11)
RDW: 14.1 % (ref 11.5–15.5)
WBC: 7.5 10*3/uL (ref 4.0–10.5)

## 2015-12-12 LAB — TYPE AND SCREEN
ABO/RH(D): O POS
ANTIBODY SCREEN: NEGATIVE

## 2015-12-12 LAB — SURGICAL PCR SCREEN
MRSA, PCR: NEGATIVE
Staphylococcus aureus: NEGATIVE

## 2015-12-12 LAB — ABO/RH: ABO/RH(D): O POS

## 2015-12-12 LAB — APTT: APTT: 29 s (ref 24–36)

## 2015-12-12 NOTE — Progress Notes (Signed)
Anesthesia Chart Review: Patient is a 80 year old female scheduled for right carotid endarterectomy on 12/13/15 by Dr. Donnetta Hutching. PAT was this afternoon. She had a recent episode of  Possible right eye amaurosis fugax around ~ 08/2015. Carotid U/S showed severe RICA stenosis.   History includes non-smoker, HTN, TIA, carotid artery stenosis, varicose veins, GERD, C. Difficile colitis '04, cholecystectomy, appendectomy, left L4-5 microdiscectomy '12, tonsillectomy, hysterectomy, abdominal surgery '95.   PCP is Dr. Bertram Millard with [facility].  She is not routinely followed by cardiology, but saw Dr. Agustin Cree approximately three years ago and reportedly had normal cardiac testing (she believes she had an echo and Holter). (Dr. Agustin Cree is now with [facility], but they did not have records on this patient. Prior to being [facility], that cardiology practice was with [facility] so records requested from their centralized medical records department, but records are still pending.)  Meds include ASA 81 mg, Bentyl, fenofibrate, lisinopril-HCTZ, melatonin, Prilosec, Zoloft.  BP (!) 163/74   Pulse 64   Temp 36.8 C (Oral)   Resp 20   Ht 5\' 4"  (1.626 m)   Wt 145 lb (65.8 kg)   SpO2 99%   BMI 24.89 kg/m    12/12/15 EKG: SB at 59 bpm.  By PCP notes (scanned under Media tab), she had a negative treadmill stress test in 1999. As above, [facility] records requested this afternoon; however, by 4:45 PM records had not been received and when I called ([phone]) message stated that centralized medical records closed at 4:30 PM.  11/21/15 Carotid U/S: Impression: 1. 80-99% RICA stenosis. 2. < [id number] LICA stenosis.  Preoperative labs noted. Cr 1.41. CBC WNL. Glucose 109.   PAT results appear acceptable. Further evaluation by her anesthesiologist on the day of surgery to discuss the definitive anesthesia plan. He/She can review any additional records, if  received.  George Hugh [facility]/Anesthesiology Phone [phone] 12/12/2015 4:52 PM

## 2015-12-12 NOTE — Progress Notes (Signed)
PCP:Dr. Humphrey Rolls @ [facility] in [place]  Cardiologist: none-- pt. States 3 yrs ago she saw Dr. Agustin Cree @ [facility] for increase blood pressure, requested echo/notes/other studies/holter monitor?.Stated everything was normal and has not been back since.

## 2015-12-12 NOTE — Pre-Procedure Instructions (Signed)
    Carolyn [name]  12/12/2015      [facility] [address], [place] [address] Phone: [phone] Fax: [phone]  [name] [address] [place] [address] Phone: [phone] Fax: [phone]    Your procedure is scheduled on Wed., Dec 6  Report to [facility] Admitting at 8:30 A.M.  Call this number if you have problems the morning of surgery:  [phone]   Remember:  Do not eat food or drink liquids after midnight.  Take these medicines the morning of surgery with A SIP OF WATER : aspirin, dicyclomine (bentyl) if needed, omeprazole (prilosec), sertraline (zoloft)              Stop: advil, motrin, ibuprofen, BC Powders, Goody's, vitamins/herbal medicines.   Do not wear jewelry, make-up or nail polish.  Do not wear lotions, powders, or perfumes, or deoderant.  Do not shave 48 hours prior to surgery.  Men may shave face and neck.  Do not bring valuables to the hospital.  [facility] is not responsible for any belongings or valuables.  Contacts, dentures or bridgework may not be worn into surgery.  Leave your suitcase in the car.  After surgery it may be brought to your room.  For patients admitted to the hospital, discharge time will be determined by your treatment team.  Patients discharged the day of surgery will not be allowed to drive home.    Special instructions:  Review preparing for surgery  Please read over the following fact sheets that you were given. Coughing and Deep Breathing and MRSA Information

## 2015-12-13 ENCOUNTER — Inpatient Hospital Stay (HOSPITAL_COMMUNITY)
Admission: RE | Admit: 2015-12-13 | Discharge: 2015-12-14 | DRG: 039 | Disposition: A | Discharge status: Home / Self Care | Payer: Medicare Other | Source: Ambulatory Visit | Attending: Vascular Surgery | Admitting: Vascular Surgery

## 2015-12-13 ENCOUNTER — Inpatient Hospital Stay (HOSPITAL_COMMUNITY): Payer: Medicare Other | Admitting: Vascular Surgery

## 2015-12-13 ENCOUNTER — Encounter (HOSPITAL_COMMUNITY)
Admission: RE | Disposition: A | Discharge status: Home / Self Care | Payer: Self-pay | Source: Ambulatory Visit | Attending: Vascular Surgery

## 2015-12-13 ENCOUNTER — Inpatient Hospital Stay (HOSPITAL_COMMUNITY): Payer: Medicare Other | Admitting: Anesthesiology

## 2015-12-13 DIAGNOSIS — K219 Gastro-esophageal reflux disease without esophagitis: Secondary | ICD-10-CM | POA: Diagnosis present

## 2015-12-13 DIAGNOSIS — I6521 Occlusion and stenosis of right carotid artery: Secondary | ICD-10-CM | POA: Diagnosis not present

## 2015-12-13 DIAGNOSIS — I6529 Occlusion and stenosis of unspecified carotid artery: Secondary | ICD-10-CM | POA: Diagnosis present

## 2015-12-13 DIAGNOSIS — Z8673 Personal history of transient ischemic attack (TIA), and cerebral infarction without residual deficits: Secondary | ICD-10-CM | POA: Diagnosis not present

## 2015-12-13 DIAGNOSIS — Z880 Allergy status to penicillin: Secondary | ICD-10-CM | POA: Diagnosis not present

## 2015-12-13 DIAGNOSIS — I10 Essential (primary) hypertension: Secondary | ICD-10-CM | POA: Diagnosis not present

## 2015-12-13 DIAGNOSIS — Z7982 Long term (current) use of aspirin: Secondary | ICD-10-CM

## 2015-12-13 DIAGNOSIS — Z79899 Other long term (current) drug therapy: Secondary | ICD-10-CM

## 2015-12-13 DIAGNOSIS — I83892 Varicose veins of left lower extremities with other complications: Secondary | ICD-10-CM | POA: Diagnosis not present

## 2015-12-13 HISTORY — PX: PATCH ANGIOPLASTY: SHX6230

## 2015-12-13 HISTORY — PX: ENDARTERECTOMY: SHX5162

## 2015-12-13 LAB — CBC
HCT: 36 % (ref 36.0–46.0)
Hemoglobin: 11.6 g/dL — ABNORMAL LOW (ref 12.0–15.0)
MCH: 29.3 pg (ref 26.0–34.0)
MCHC: 32.2 g/dL (ref 30.0–36.0)
MCV: 90.9 fL (ref 78.0–100.0)
PLATELETS: 132 10*3/uL — AB (ref 150–400)
RBC: 3.96 MIL/uL (ref 3.87–5.11)
RDW: 14.2 % (ref 11.5–15.5)
WBC: 7.6 10*3/uL (ref 4.0–10.5)

## 2015-12-13 LAB — CREATININE, SERUM
Creatinine, Ser: 1.12 mg/dL — ABNORMAL HIGH (ref 0.44–1.00)
GFR calc non Af Amer: 46 mL/min — ABNORMAL LOW (ref >60)
GFR, EST AFRICAN AMERICAN: 53 mL/min — AB (ref >60)

## 2015-12-13 SURGERY — ENDARTERECTOMY, CAROTID
Anesthesia: General | Site: Neck | Laterality: Right

## 2015-12-13 MED ORDER — GLYCOPYRROLATE 0.2 MG/ML IJ SOLN
INTRAMUSCULAR | Status: DC | PRN
  Administered 2015-12-13: 0.2 mg via INTRAVENOUS

## 2015-12-13 MED ORDER — ACETAMINOPHEN 325 MG RE SUPP: 325.0000 mg | RECTAL | Status: DC | PRN

## 2015-12-13 MED ORDER — FENTANYL CITRATE (PF) 100 MCG/2ML IJ SOLN
INTRAMUSCULAR | Status: DC | PRN
  Administered 2015-12-13 (×2): 100 ug via INTRAVENOUS

## 2015-12-13 MED ORDER — CHLORHEXIDINE GLUCONATE CLOTH 2 % EX PADS: 6.0000 | MEDICATED_PAD | Freq: Once | CUTANEOUS | Status: DC

## 2015-12-13 MED ORDER — POTASSIUM CHLORIDE CRYS ER 20 MEQ PO TBCR: 20.0000 meq | EXTENDED_RELEASE_TABLET | Freq: Every day | ORAL | Status: DC | PRN

## 2015-12-13 MED ORDER — SUGAMMADEX SODIUM 200 MG/2ML IV SOLN
INTRAVENOUS | Status: DC | PRN
  Administered 2015-12-13: 150 mg via INTRAVENOUS

## 2015-12-13 MED ORDER — ASPIRIN EC 81 MG PO TBEC
81.0000 mg | DELAYED_RELEASE_TABLET | Freq: Every day | ORAL | Status: DC
  Administered 2015-12-14: 81 mg via ORAL
  Filled 2015-12-13: qty 1

## 2015-12-13 MED ORDER — METOPROLOL TARTRATE 5 MG/5ML IV SOLN: 2.0000 mg | INTRAVENOUS | Status: DC | PRN

## 2015-12-13 MED ORDER — LIDOCAINE 2% (20 MG/ML) 5 ML SYRINGE
INTRAMUSCULAR | Status: AC
  Filled 2015-12-13: qty 5

## 2015-12-13 MED ORDER — ACETAMINOPHEN 325 MG PO TABS
325.0000 mg | ORAL_TABLET | ORAL | Status: DC | PRN
  Administered 2015-12-13: 650 mg via ORAL
  Filled 2015-12-13: qty 2

## 2015-12-13 MED ORDER — OXYCODONE-ACETAMINOPHEN 5-325 MG PO TABS
1.0000 | ORAL_TABLET | ORAL | Status: DC | PRN
  Administered 2015-12-14: 1 via ORAL
  Filled 2015-12-13: qty 1

## 2015-12-13 MED ORDER — PROPOFOL 10 MG/ML IV BOLUS
INTRAVENOUS | Status: AC
  Filled 2015-12-13: qty 20

## 2015-12-13 MED ORDER — 0.9 % SODIUM CHLORIDE (POUR BTL) OPTIME
TOPICAL | Status: DC | PRN
  Administered 2015-12-13: 2000 mL

## 2015-12-13 MED ORDER — FENTANYL CITRATE (PF) 100 MCG/2ML IJ SOLN
INTRAMUSCULAR | Status: AC
  Filled 2015-12-13: qty 2

## 2015-12-13 MED ORDER — PHENYLEPHRINE 40 MCG/ML (10ML) SYRINGE FOR IV PUSH (FOR BLOOD PRESSURE SUPPORT)
PREFILLED_SYRINGE | INTRAVENOUS | Status: AC
  Filled 2015-12-13: qty 10

## 2015-12-13 MED ORDER — LISINOPRIL-HYDROCHLOROTHIAZIDE 20-12.5 MG PO TABS: 1.0000 | ORAL_TABLET | Freq: Every day | ORAL | Status: DC

## 2015-12-13 MED ORDER — OXYCODONE HCL 5 MG PO TABS: 5.0000 mg | ORAL_TABLET | Freq: Once | ORAL | Status: DC | PRN

## 2015-12-13 MED ORDER — SODIUM CHLORIDE 0.9 % IV SOLN
INTRAVENOUS | Status: DC | PRN
  Administered 2015-12-13: 11:00:00 500 mL

## 2015-12-13 MED ORDER — VANCOMYCIN HCL IN DEXTROSE 1-5 GM/200ML-% IV SOLN
1000.0000 mg | INTRAVENOUS | Status: AC
  Administered 2015-12-13: 1000 mg via INTRAVENOUS
  Filled 2015-12-13: qty 200

## 2015-12-13 MED ORDER — BISACODYL 5 MG PO TBEC: 5.0000 mg | DELAYED_RELEASE_TABLET | Freq: Every day | ORAL | Status: DC | PRN

## 2015-12-13 MED ORDER — LABETALOL HCL 5 MG/ML IV SOLN
INTRAVENOUS | Status: DC | PRN
  Administered 2015-12-13: 10 mg via INTRAVENOUS

## 2015-12-13 MED ORDER — ONDANSETRON HCL 4 MG/2ML IJ SOLN
INTRAMUSCULAR | Status: AC
  Filled 2015-12-13: qty 2

## 2015-12-13 MED ORDER — LACTATED RINGERS IV SOLN
INTRAVENOUS | Status: DC | PRN
  Administered 2015-12-13 (×2): via INTRAVENOUS

## 2015-12-13 MED ORDER — LACTATED RINGERS IV SOLN
INTRAVENOUS | Status: DC
  Administered 2015-12-13: 09:00:00 via INTRAVENOUS

## 2015-12-13 MED ORDER — LISINOPRIL 20 MG PO TABS
20.0000 mg | ORAL_TABLET | Freq: Every day | ORAL | Status: DC
  Administered 2015-12-14: 20 mg via ORAL
  Filled 2015-12-13: qty 1

## 2015-12-13 MED ORDER — PROTAMINE SULFATE 10 MG/ML IV SOLN
INTRAVENOUS | Status: DC | PRN
  Administered 2015-12-13 (×2): 10 mg via INTRAVENOUS
  Administered 2015-12-13: 30 mg via INTRAVENOUS

## 2015-12-13 MED ORDER — LIDOCAINE HCL (PF) 1 % IJ SOLN
INTRAMUSCULAR | Status: AC
  Filled 2015-12-13: qty 30

## 2015-12-13 MED ORDER — GUAIFENESIN-DM 100-10 MG/5ML PO SYRP: 15.0000 mL | ORAL_SOLUTION | ORAL | Status: DC | PRN

## 2015-12-13 MED ORDER — ONDANSETRON HCL 4 MG/2ML IJ SOLN: 4.0000 mg | Freq: Four times a day (QID) | INTRAMUSCULAR | Status: DC | PRN

## 2015-12-13 MED ORDER — FENTANYL CITRATE (PF) 250 MCG/5ML IJ SOLN
INTRAMUSCULAR | Status: AC
  Filled 2015-12-13: qty 10

## 2015-12-13 MED ORDER — PANTOPRAZOLE SODIUM 40 MG PO TBEC
40.0000 mg | DELAYED_RELEASE_TABLET | Freq: Every day | ORAL | Status: DC
  Administered 2015-12-14: 40 mg via ORAL
  Filled 2015-12-13: qty 1

## 2015-12-13 MED ORDER — SERTRALINE HCL 50 MG PO TABS
50.0000 mg | ORAL_TABLET | Freq: Every day | ORAL | Status: DC
  Administered 2015-12-14: 50 mg via ORAL
  Filled 2015-12-13: qty 1

## 2015-12-13 MED ORDER — MAGNESIUM SULFATE 2 GM/50ML IV SOLN: 2.0000 g | Freq: Every day | INTRAVENOUS | Status: DC | PRN

## 2015-12-13 MED ORDER — PHENYLEPHRINE HCL 10 MG/ML IJ SOLN
INTRAVENOUS | Status: DC | PRN
  Administered 2015-12-13: 25 ug/min via INTRAVENOUS

## 2015-12-13 MED ORDER — GLYCOPYRROLATE 0.2 MG/ML IV SOSY
PREFILLED_SYRINGE | INTRAVENOUS | Status: AC
  Filled 2015-12-13: qty 3

## 2015-12-13 MED ORDER — MORPHINE SULFATE (PF) 2 MG/ML IV SOLN: 2.0000 mg | INTRAVENOUS | Status: DC | PRN

## 2015-12-13 MED ORDER — SUGAMMADEX SODIUM 200 MG/2ML IV SOLN
INTRAVENOUS | Status: AC
  Filled 2015-12-13: qty 2

## 2015-12-13 MED ORDER — FENOFIBRATE 160 MG PO TABS
160.0000 mg | ORAL_TABLET | Freq: Every day | ORAL | Status: DC
  Administered 2015-12-14: 160 mg via ORAL
  Filled 2015-12-13: qty 1

## 2015-12-13 MED ORDER — HYDRALAZINE HCL 20 MG/ML IJ SOLN
INTRAMUSCULAR | Status: AC
  Filled 2015-12-13: qty 1

## 2015-12-13 MED ORDER — LABETALOL HCL 5 MG/ML IV SOLN: 10.0000 mg | INTRAVENOUS | Status: DC | PRN

## 2015-12-13 MED ORDER — HEPARIN SODIUM (PORCINE) 1000 UNIT/ML IJ SOLN
INTRAMUSCULAR | Status: DC | PRN
  Administered 2015-12-13: 7000 [IU] via INTRAVENOUS

## 2015-12-13 MED ORDER — HYDROCHLOROTHIAZIDE 12.5 MG PO CAPS
12.5000 mg | ORAL_CAPSULE | Freq: Every day | ORAL | Status: DC
  Administered 2015-12-14: 12.5 mg via ORAL
  Filled 2015-12-13: qty 1

## 2015-12-13 MED ORDER — LISINOPRIL 20 MG PO TABS: 20.0000 mg | ORAL_TABLET | Freq: Every day | ORAL | Status: DC

## 2015-12-13 MED ORDER — LABETALOL HCL 5 MG/ML IV SOLN
INTRAVENOUS | Status: AC
  Filled 2015-12-13: qty 4

## 2015-12-13 MED ORDER — PHENOL 1.4 % MT LIQD: 1.0000 | OROMUCOSAL | Status: DC | PRN

## 2015-12-13 MED ORDER — OXYCODONE HCL 5 MG/5ML PO SOLN: 5.0000 mg | Freq: Once | ORAL | Status: DC | PRN

## 2015-12-13 MED ORDER — LISINOPRIL 5 MG PO TABS
5.0000 mg | ORAL_TABLET | Freq: Every day | ORAL | Status: DC
  Administered 2015-12-13: 5 mg via ORAL
  Filled 2015-12-13: qty 1

## 2015-12-13 MED ORDER — DICYCLOMINE HCL 10 MG PO CAPS: 10.0000 mg | ORAL_CAPSULE | Freq: Four times a day (QID) | ORAL | Status: DC | PRN

## 2015-12-13 MED ORDER — DOCUSATE SODIUM 100 MG PO CAPS
100.0000 mg | ORAL_CAPSULE | Freq: Every day | ORAL | Status: DC
  Administered 2015-12-14: 100 mg via ORAL
  Filled 2015-12-13: qty 1

## 2015-12-13 MED ORDER — LIDOCAINE HCL (CARDIAC) 20 MG/ML IV SOLN
INTRAVENOUS | Status: DC | PRN
  Administered 2015-12-13: 30 mg via INTRAVENOUS

## 2015-12-13 MED ORDER — PROPOFOL 10 MG/ML IV BOLUS
INTRAVENOUS | Status: DC | PRN
  Administered 2015-12-13: 150 mg via INTRAVENOUS

## 2015-12-13 MED ORDER — FENTANYL CITRATE (PF) 100 MCG/2ML IJ SOLN
25.0000 ug | INTRAMUSCULAR | Status: DC | PRN
  Administered 2015-12-13: 25 ug via INTRAVENOUS

## 2015-12-13 MED ORDER — VANCOMYCIN HCL IN DEXTROSE 1-5 GM/200ML-% IV SOLN
1000.0000 mg | Freq: Once | INTRAVENOUS | Status: AC
  Administered 2015-12-14: 1000 mg via INTRAVENOUS
  Filled 2015-12-13: qty 200

## 2015-12-13 MED ORDER — ENOXAPARIN SODIUM 40 MG/0.4ML ~~LOC~~ SOLN: 40.0000 mg | SUBCUTANEOUS | Status: DC

## 2015-12-13 MED ORDER — SENNOSIDES-DOCUSATE SODIUM 8.6-50 MG PO TABS: 1.0000 | ORAL_TABLET | Freq: Every evening | ORAL | Status: DC | PRN

## 2015-12-13 MED ORDER — SODIUM CHLORIDE 0.9 % IV SOLN
INTRAVENOUS | Status: DC
  Administered 2015-12-13: 19:00:00 via INTRAVENOUS

## 2015-12-13 MED ORDER — ROCURONIUM BROMIDE 100 MG/10ML IV SOLN
INTRAVENOUS | Status: DC | PRN
  Administered 2015-12-13: 50 mg via INTRAVENOUS
  Administered 2015-12-13: 10 mg via INTRAVENOUS

## 2015-12-13 MED ORDER — ONDANSETRON HCL 4 MG/2ML IJ SOLN
INTRAMUSCULAR | Status: DC | PRN
  Administered 2015-12-13: 4 mg via INTRAVENOUS

## 2015-12-13 MED ORDER — SODIUM CHLORIDE 0.9 % IV SOLN: 500.0000 mL | Freq: Once | INTRAVENOUS | Status: DC | PRN

## 2015-12-13 MED ORDER — MELATONIN 3 MG PO TABS
3.0000 mg | ORAL_TABLET | Freq: Every evening | ORAL | Status: DC | PRN
  Administered 2015-12-13: 3 mg via ORAL
  Filled 2015-12-13 (×3): qty 1

## 2015-12-13 MED ORDER — ROCURONIUM BROMIDE 10 MG/ML (PF) SYRINGE
PREFILLED_SYRINGE | INTRAVENOUS | Status: AC
  Filled 2015-12-13: qty 10

## 2015-12-13 MED ORDER — SODIUM CHLORIDE 0.9 % IV SOLN: INTRAVENOUS | Status: DC

## 2015-12-13 MED ORDER — HYDRALAZINE HCL 20 MG/ML IJ SOLN
5.0000 mg | INTRAMUSCULAR | Status: DC | PRN
  Administered 2015-12-13: 5 mg via INTRAVENOUS

## 2015-12-13 MED ORDER — ALUM & MAG HYDROXIDE-SIMETH 200-200-20 MG/5ML PO SUSP: 15.0000 mL | ORAL | Status: DC | PRN

## 2015-12-13 SURGICAL SUPPLY — 42 items
ADH SKN CLS APL DERMABOND .7 (GAUZE/BANDAGES/DRESSINGS) ×1
CANISTER SUCTION 2500CC (MISCELLANEOUS) ×3 IMPLANT
CANNULA VESSEL 3MM 2 BLNT TIP (CANNULA) ×6 IMPLANT
CATH ROBINSON RED A/P 18FR (CATHETERS) ×3 IMPLANT
CLIP LIGATING EXTRA MED SLVR (CLIP) ×3 IMPLANT
CLIP LIGATING EXTRA SM BLUE (MISCELLANEOUS) ×3 IMPLANT
CRADLE DONUT ADULT HEAD (MISCELLANEOUS) ×3 IMPLANT
DECANTER SPIKE VIAL GLASS SM (MISCELLANEOUS) IMPLANT
DERMABOND ADVANCED (GAUZE/BANDAGES/DRESSINGS) ×2
DERMABOND ADVANCED .7 DNX12 (GAUZE/BANDAGES/DRESSINGS) ×1 IMPLANT
DRAIN HEMOVAC 1/8 X 5 (WOUND CARE) IMPLANT
ELECT REM PT RETURN 9FT ADLT (ELECTROSURGICAL) ×3
ELECTRODE REM PT RTRN 9FT ADLT (ELECTROSURGICAL) ×1 IMPLANT
EVACUATOR SILICONE 100CC (DRAIN) IMPLANT
GLOVE BIOGEL PI IND STRL 6.5 (GLOVE) ×3 IMPLANT
GLOVE BIOGEL PI IND STRL 7.5 (GLOVE) ×1 IMPLANT
GLOVE BIOGEL PI INDICATOR 6.5 (GLOVE) ×6
GLOVE BIOGEL PI INDICATOR 7.5 (GLOVE) ×2
GLOVE ECLIPSE 6.5 STRL STRAW (GLOVE) ×6 IMPLANT
GLOVE ECLIPSE 7.0 STRL STRAW (GLOVE) ×3 IMPLANT
GLOVE SS BIOGEL STRL SZ 7.5 (GLOVE) ×1 IMPLANT
GLOVE SUPERSENSE BIOGEL SZ 7.5 (GLOVE) ×2
GOWN STRL REUS W/ TWL LRG LVL3 (GOWN DISPOSABLE) ×4 IMPLANT
GOWN STRL REUS W/TWL LRG LVL3 (GOWN DISPOSABLE) ×12
KIT BASIN OR (CUSTOM PROCEDURE TRAY) ×3 IMPLANT
KIT ROOM TURNOVER OR (KITS) ×3 IMPLANT
NEEDLE 22X1 1/2 (OR ONLY) (NEEDLE) IMPLANT
NS IRRIG 1000ML POUR BTL (IV SOLUTION) ×6 IMPLANT
PACK CAROTID (CUSTOM PROCEDURE TRAY) ×3 IMPLANT
PAD ARMBOARD 7.5X6 YLW CONV (MISCELLANEOUS) ×6 IMPLANT
PATCH HEMASHIELD 8X75 (Vascular Products) ×3 IMPLANT
SHUNT CAROTID BYPASS 10 (VASCULAR PRODUCTS) ×3 IMPLANT
SHUNT CAROTID BYPASS 12FRX15.5 (VASCULAR PRODUCTS) IMPLANT
SUT ETHILON 3 0 PS 1 (SUTURE) IMPLANT
SUT PROLENE 6 0 CC (SUTURE) ×6 IMPLANT
SUT SILK 3 0 (SUTURE)
SUT SILK 3-0 18XBRD TIE 12 (SUTURE) IMPLANT
SUT VIC AB 3-0 SH 27 (SUTURE) ×6
SUT VIC AB 3-0 SH 27X BRD (SUTURE) ×2 IMPLANT
SUT VICRYL 4-0 PS2 18IN ABS (SUTURE) ×3 IMPLANT
SYR CONTROL 10ML LL (SYRINGE) IMPLANT
WATER STERILE IRR 1000ML POUR (IV SOLUTION) ×3 IMPLANT

## 2015-12-13 NOTE — H&P (View-Only) (Signed)
[facility]ist of [place]  Patient name: NAYELIS CAFARELLI MRN: [id number] DOB: 03-Mar-1935 Sex: female  REASON FOR VISIT: Discuss symptomatic right carotid stenosis  HPI: Carolyn [name] is a 80 y.o. female ear today for discussion of symptomatic right carotid stenosis. She's been seen by Dr. Kellie Simmering who obtained a carotid duplex. She had an episode several weeks ago very clear-cut total blindness in her right eye which was temporary and resolved completely. She is right-handed. She did not have any left body symptoms. Had no prior episodes of visual changes or weakness. She is quite healthy. She is here today with her daughter-in-law who is a Marine scientist at [employer]. No prior cardiac difficulties.  Past Medical History:  Diagnosis Date  . Carotid artery narrowing   . Hypertension   . TIA (transient ischemic attack)    "not sure, mentioned in past and recently"    History reviewed. No pertinent family history.  SOCIAL HISTORY: Social History  Substance Use Topics  . Smoking status: Never Smoker  . Smokeless tobacco: Never Used  . Alcohol use No    Allergies  Allergen Reactions  . Penicillins Hives    Current Outpatient Prescriptions  Medication Sig Dispense Refill  . aspirin EC 81 MG tablet Take 81 mg by mouth daily.    . cloNIDine (CATAPRES) 0.1 MG tablet Take 0.1 mg by mouth 2 (two) times daily.    Marland Kitchen dicyclomine (BENTYL) 10 MG capsule Take 10 mg by mouth 4 (four) times daily as needed for spasms.     . fenofibrate micronized (LOFIBRA) 134 MG capsule Take 134 mg by mouth daily before breakfast.    . hydrochlorothiazide (HYDRODIURIL) 25 MG tablet Take 25 mg by mouth daily.    Marland Kitchen lisinopril (PRINIVIL,ZESTRIL) 10 MG tablet Take 10 mg by mouth daily.    Marland Kitchen omeprazole (PRILOSEC) 20 MG capsule Take 20 mg by mouth daily.    . sodium-potassium bicarbonate (ALKA-SELTZER GOLD) TBEF dissolvable tablet Take 2 tablets by mouth daily  as needed (for indigestion).     No current facility-administered medications for this visit.     REVIEW OF SYSTEMS:  [X]  denotes positive finding, [ ]  denotes negative finding Cardiac  Comments:  Chest pain or chest pressure:    Shortness of breath upon exertion:    Short of breath when lying flat:    Irregular heart rhythm:        Vascular    Pain in calf, thigh, or hip brought on by ambulation:    Pain in feet at night that wakes you up from your sleep:     Blood clot in your veins:    Leg swelling:           PHYSICAL EXAM: Vitals:   11/28/15 0836 11/28/15 0838  BP: (!) 163/68 (!) 167/66  Pulse: (!) 56   Resp: 18   Temp: 97.8 F (36.6 C)   TempSrc: Oral   SpO2: 96%   Weight: 145 lb (65.8 kg)   Height: 5\' 4"  (1.626 m)     GENERAL: The patient is a well-nourished female, in no acute distress. The vital signs are documented above. CARDIOVASCULAR: Does have a harsh right carotid bruit and no bruit on the left. Carotid pulsation on the right is below the angle of the jaw. She does have 2+ radial pulses. PULMONARY: There is good air exchange  MUSCULOSKELETAL: There are no major deformities or cyanosis. NEUROLOGIC: No focal weakness or paresthesias are detected. SKIN:  There are no ulcers or rashes noted. PSYCHIATRIC: The patient has a normal affect.  DATA:  I reviewed her duplex showing a critical stenosis in her right internal carotid arterystenosis in her left carotid.  MEDICAL ISSUES: Had long discussion with the patient and her daughter-in-law. Explain the significance of her high-grade stenosis with probable resultant amaurosis fugax. Explain the significant risk of stroke with symptomatic right carotid stenosis. I have recommended right carotid endarterectomy for reduction of her stroke risk. I explained the procedure with a 1 night expected hospitalization. I did explain the 1-2% risk of stroke with surgery and also the very low risk of cranial nerve injury  associated with surgery. She understands and wishes to proceed we will schedule this within the next 1-2 weeks.    Rosetta Posner, MD FACS [facility] Tel [phone] Pager [phone]

## 2015-12-13 NOTE — Anesthesia Preprocedure Evaluation (Signed)
Anesthesia Evaluation    Reviewed: Unable to perform ROS - Chart review only  Airway        Dental   Pulmonary           Cardiovascular hypertension,      Neuro/Psych    GI/Hepatic GERD  ,  Endo/Other    Renal/GU      Musculoskeletal   Abdominal   Peds  Hematology   Anesthesia Other Findings   Reproductive/Obstetrics                             Anesthesia Physical Anesthesia Plan Anesthesia Quick Evaluation

## 2015-12-13 NOTE — Transfer of Care (Signed)
Immediate Anesthesia Transfer of Care Note  Patient: Carolyn [name]  Procedure(s) Performed: Procedure(s): RIGHT CAROTID ENDARTERECTOMY (Right) PATCH ANGIOPLASTY USING HEMASHIELD PLATINUM FINESSE 0.3inx3in PATCH (Right)  Patient Location: PACU  Anesthesia Type:General  Level of Consciousness: awake, alert  and oriented  Airway & Oxygen Therapy: Patient Spontanous Breathing and Patient connected to nasal cannula oxygen  Post-op Assessment: Report given to RN and Post -op Vital signs reviewed and stable  Post vital signs: Reviewed and stable  Last Vitals:  Vitals:   12/13/15 0907  Pulse: (!) 54  Resp: 20  Temp: 36.6 C    Last Pain:  Vitals:   12/13/15 0907  TempSrc: Oral         Complications: No apparent anesthesia complications

## 2015-12-13 NOTE — Interval H&P Note (Signed)
History and Physical Interval Note:  12/13/2015 8:49 AM  Carolyn [name]  has presented today for surgery, with the diagnosis of Right carotid artery stenosis I65.21  The various methods of treatment have been discussed with the patient and family. After consideration of risks, benefits and other options for treatment, the patient has consented to  Procedure(s): ENDARTERECTOMY CAROTID (Right) as a surgical intervention .  The patient's history has been reviewed, patient examined, no change in status, stable for surgery.  I have reviewed the patient's chart and labs.  Questions were answered to the patient's satisfaction.     Curt Jews

## 2015-12-13 NOTE — Progress Notes (Signed)
PHARMACY NOTE:  ANTIMICROBIAL RENAL DOSAGE ADJUSTMENT  Current antimicrobial regimen includes a mismatch between antimicrobial dosage and estimated renal function.  As per policy approved by the Pharmacy & Therapeutics and Medical Executive Committees, the antimicrobial dosage will be adjusted accordingly.  Current antimicrobial dosage:  Vanc 1gm IV Q12H x 2 doses  Indication: surgical prophylaxis  Renal Function: CrCL 30 ml/min  Estimated Creatinine Clearance: 30.7 mL/min (by C-G formula based on SCr of 1.41 mg/dL (H)). []      On intermittent HD, scheduled: []      On CRRT    Antimicrobial dosage has been changed to:  Vancomycin 1gm IV x 1, give 24 hours post previous dose  Additional comments:  N/A   Thank you for allowing pharmacy to be a part of this patient's care.  Efren Kross D. Mina Marble, PharmD, BCPS Pager:  [phone] 12/13/2015, 6:42 PM

## 2015-12-13 NOTE — Anesthesia Postprocedure Evaluation (Signed)
Anesthesia Post Note  Patient: CHELSEI BEIDLEMAN  Procedure(s) Performed: Procedure(s) (LRB): RIGHT CAROTID ENDARTERECTOMY (Right) PATCH ANGIOPLASTY USING HEMASHIELD PLATINUM FINESSE 0.3inx3in PATCH (Right)  Patient location during evaluation: PACU Anesthesia Type: General Level of consciousness: awake Pain management: pain level controlled Vital Signs Assessment: post-procedure vital signs reviewed and stable Respiratory status: spontaneous breathing Cardiovascular status: stable Postop Assessment: no signs of nausea or vomiting Anesthetic complications: no    Last Vitals:  Vitals:   12/13/15 1703 12/13/15 1720  BP: (!) 125/49 (!) 137/47  Pulse: 63 68  Resp: 13 (!) 22  Temp:      Last Pain:  Vitals:   12/13/15 1720  TempSrc:   PainSc: Asleep                 Aymara Sassi

## 2015-12-13 NOTE — Op Note (Signed)
    OPERATIVE REPORT  DATE OF SURGERY: 12/13/2015  PATIENT: Carolyn [name], 80 y.o. female MRN: [id number]  DOB: 19-Jul-1935  PRE-OPERATIVE DIAGNOSIS: Symptomatic right carotid stenosis  POST-OPERATIVE DIAGNOSIS:  Same  PROCEDURE: Right carotid endarterectomy and Dacron patch angioplasty  SURGEON:  Curt Jews, M.D.  PHYSICIAN ASSISTANT: Dyanne Iha PA-C  ANESTHESIA:  Gen.  EBL: 200 ml  Total I/O In: 1300 [I.V.:1300] Out: 200 [Blood:200]  BLOOD ADMINISTERED: None  DRAINS: None  SPECIMEN: None  COUNTS CORRECT:  YES  PLAN OF CARE: PACU   PATIENT DISPOSITION:  PACU - hemodynamically stable  PROCEDURE DETAILS: Patient was taken to the operative placed supine position where the area of the right  Draped in usual sterile fashion. An incision was made anterior to the sternocleidomastoid and carried down through the platysma with cautery. The sternocleidomastoid reflected posteriorly and the carotid sheath was opened. The patient vein was ligated with 2-0 silk ties and divided. The vagus and hypoglossal nerves were identified and preserved. The common carotid artery was encircled with an umbilical tape and Rummel tourniquet. Dissection was continued onto the bifurcation. The superior thyroid artery was encircled with a 2-0 silk Potts tie. The external carotid was encircled with a blue vessel loop and the internal carotid was encircled with an umbilical tape and Rummel tourniquet. The patient was given 7000 units intravenous heparin. After adequate circulation time the internal/external and common carotid arteries were occluded. The common carotid artery was opened with 11 blade and sent longitudinally with Potts scissors. A 10 shunt was passed up the internal carotid and then allowed to back bleed and down the common carotid were secured with Rummel tourniquet. The endarterectomy was again on the common carotid artery and the plaque was divided proximally with Potts scissors. The  endarterectomy was controlled the bifurcation and external carotid was endarterectomized with an eversion technique and the internal carotid was endarterectomized in an open fashion. Remaining atheromatous debris was removed from the endarterectomy plane. A Finesse Hemashield Dacron patch was brought onto the field was sewn as a patch angioplasty with a running 6-0 Prolene suture. Prior to completion of the closure the shunt was removed in the usual flushing maneuvers were undertaken. Anastomosis completed and flow was restored first to the external and then the internal carotid artery. Excellent flow characteristics were noted with hand-held Doppler on the internal and external carotid arteries. The patient was given 50 mg of protamine to reverse the heparin. Wounds irrigated with saline. Hemostasis tablet cautery. Wounds were closed with 3-0 Vicryl in the subcutaneous and subcuticular tissue. Sterile dressing was applied the patient was transferred to the recovery room neurologically intact   Rosetta Posner, M.D., [facility] 12/13/2015 4:15 PM

## 2015-12-13 NOTE — Anesthesia Preprocedure Evaluation (Addendum)
Anesthesia Evaluation  Patient identified by MRN, date of birth, ID band Patient awake    Reviewed: Allergy & Precautions, NPO status , Patient's Chart, lab work & pertinent test results  History of Anesthesia Complications Negative for: history of anesthetic complications  Airway Mallampati: II  TM Distance: >3 FB Neck ROM: Full    Dental  (+) Teeth Intact   Pulmonary neg shortness of breath, neg sleep apnea, neg COPD, neg recent URI,    breath sounds clear to auscultation       Cardiovascular hypertension, Pt. on medications + Peripheral Vascular Disease   Rhythm:Regular     Neuro/Psych neg Seizures TIA   GI/Hepatic Neg liver ROS, GERD  Medicated and Controlled,  Endo/Other  negative endocrine ROS  Renal/GU Renal InsufficiencyRenal disease     Musculoskeletal   Abdominal   Peds  Hematology negative hematology ROS (+)   Anesthesia Other Findings   Reproductive/Obstetrics                            Anesthesia Physical Anesthesia Plan  ASA: III  Anesthesia Plan: General   Post-op Pain Management:    Induction: Intravenous  Airway Management Planned: Oral ETT  Additional Equipment: None  Intra-op Plan:   Post-operative Plan: Extubation in OR  Informed Consent: I have reviewed the patients History and Physical, chart, labs and discussed the procedure including the risks, benefits and alternatives for the proposed anesthesia with the patient or authorized representative who has indicated his/her understanding and acceptance.   Dental advisory given  Plan Discussed with: CRNA and Surgeon  Anesthesia Plan Comments:         Anesthesia Quick Evaluation

## 2015-12-13 NOTE — Progress Notes (Signed)
        S/P right CEA  Right radial pulse palpable  Right neck incision without hematoma No tongue deviation, smile is symmetric  Disposition stable   Ori Kreiter MAUREEN PA-C

## 2015-12-13 NOTE — Anesthesia Preprocedure Evaluation (Signed)
Anesthesia Evaluation  Patient identified by MRN, date of birth, ID band Patient awake    Airway        Dental   Pulmonary           Cardiovascular hypertension, + Peripheral Vascular Disease       Neuro/Psych TIA   GI/Hepatic   Endo/Other    Renal/GU      Musculoskeletal   Abdominal   Peds  Hematology   Anesthesia Other Findings   Reproductive/Obstetrics                             Anesthesia Physical Anesthesia Plan Anesthesia Quick Evaluation

## 2015-12-13 NOTE — Anesthesia Procedure Notes (Signed)
Procedure Name: Intubation Date/Time: 12/13/2015 11:46 AM Performed by: Eligha Bridegroom Pre-anesthesia Checklist: Patient identified, Emergency Drugs available, Patient being monitored, Suction available and Timeout performed Patient Re-evaluated:Patient Re-evaluated prior to inductionOxygen Delivery Method: Circle system utilized Preoxygenation: Pre-oxygenation with 100% oxygen Intubation Type: IV induction Ventilation: Mask ventilation without difficulty and Oral airway inserted - appropriate to patient size Laryngoscope Size: Mac and 3 Grade View: Grade II Tube type: Oral Number of attempts: 1 Placement Confirmation: ETT inserted through vocal cords under direct vision,  positive ETCO2 and breath sounds checked- equal and bilateral Secured at: 21 cm Tube secured with: Tape Dental Injury: Teeth and Oropharynx as per pre-operative assessment

## 2015-12-14 ENCOUNTER — Encounter (HOSPITAL_COMMUNITY): Payer: Self-pay | Admitting: Vascular Surgery

## 2015-12-14 LAB — BASIC METABOLIC PANEL
ANION GAP: 5 (ref 5–15)
BUN: 20 mg/dL (ref 6–20)
CHLORIDE: 108 mmol/L (ref 101–111)
CO2: 26 mmol/L (ref 22–32)
Calcium: 8.5 mg/dL — ABNORMAL LOW (ref 8.9–10.3)
Creatinine, Ser: 1.05 mg/dL — ABNORMAL HIGH (ref 0.44–1.00)
GFR calc Af Amer: 57 mL/min — ABNORMAL LOW (ref >60)
GFR calc non Af Amer: 50 mL/min — ABNORMAL LOW (ref >60)
Glucose, Bld: 89 mg/dL (ref 65–99)
POTASSIUM: 3.5 mmol/L (ref 3.5–5.1)
SODIUM: 139 mmol/L (ref 135–145)

## 2015-12-14 LAB — CBC
HCT: 33 % — ABNORMAL LOW (ref 36.0–46.0)
HEMOGLOBIN: 10.5 g/dL — AB (ref 12.0–15.0)
MCH: 28.9 pg (ref 26.0–34.0)
MCHC: 31.8 g/dL (ref 30.0–36.0)
MCV: 90.9 fL (ref 78.0–100.0)
Platelets: 119 10*3/uL — ABNORMAL LOW (ref 150–400)
RBC: 3.63 MIL/uL — AB (ref 3.87–5.11)
RDW: 14.5 % (ref 11.5–15.5)
WBC: 6.7 10*3/uL (ref 4.0–10.5)

## 2015-12-14 MED ORDER — OXYCODONE-ACETAMINOPHEN 5-325 MG PO TABS: 1.0000 | ORAL_TABLET | Freq: Four times a day (QID) | ORAL | 0 refills | Status: DC | PRN

## 2015-12-14 NOTE — Care Management Note (Signed)
Case Management Note  Patient Details  Name: OTHA CARLYLE MRN: [id number] Date of Birth: 04/22/1935  Subjective/Objective:     S/p R CEA , pta indep, for dc today, no needs.               Action/Plan:   Expected Discharge Date:  12/14/15               Expected Discharge Plan:  Home/Self Care  In-House Referral:     Discharge planning Services  CM Consult  Post Acute Care Choice:    Choice offered to:     DME Arranged:    DME Agency:     HH Arranged:    HH Agency:     Status of Service:  Completed, signed off  If discussed at [employer] of Stay Meetings, dates discussed:    Additional Comments:  Zenon Mayo, RN 12/14/2015, 10:59 AM

## 2015-12-14 NOTE — Progress Notes (Signed)
Patient ID: Carolyn [name], female   DOB: 1935-04-12, 80 y.o.   MRN: [id number] Doing well this morning. Mild soreness. Neurologically intact  Is. She has been walking in the room. Plan discharge today after breakfast. Follow-up in 2-3 weeks

## 2015-12-14 NOTE — Discharge Summary (Signed)
[facility] Discharge Summary   Patient ID:  Carolyn [name] MRN: [id number] DOB/AGE: [birth year] 80 y.o.  Admit date: 12/13/2015 Discharge date: 12/14/2015 Date of Surgery: 12/13/2015 Surgeon: Surgeon(s): Rosetta Posner, MD  Admission Diagnosis: Right carotid artery stenosis I65.21  Discharge Diagnoses:  Right carotid artery stenosis I65.21  Secondary Diagnoses: Past Medical History:  Diagnosis Date  . C. difficile diarrhea 2004  . Carotid artery narrowing   . GERD (gastroesophageal reflux disease)   . Hypertension   . Pneumonia    x3 times  . TIA (transient ischemic attack)    "not sure, mentioned in past and recently"    Procedure(s): RIGHT CAROTID ENDARTERECTOMY PATCH ANGIOPLASTY USING HEMASHIELD PLATINUM FINESSE 0.3inx3in PATCH  Discharged Condition: good  HPI: Carolyn [name] is a 80 y.o. female ear today for discussion of symptomatic right carotid stenosis. She's been seen by Dr. Kellie Simmering who obtained a carotid duplex. She had an episode several weeks ago very clear-cut total blindness in her right eye which was temporary and resolved completely. She is right-handed. She did not have any left body symptoms. Had no prior episodes of visual changes or weakness. She is quite healthy. She is here today with her daughter-in-law who is a Marine scientist at [employer]. No prior cardiac difficulties.   Hospital Course:  Carolyn [name] is a 80 y.o. female is S/P Procedure(s): RIGHT CAROTID ENDARTERECTOMY PATCH ANGIOPLASTY USING HEMASHIELD PLATINUM FINESSE 0.3inx3in PATCH  POD# 1 Tolerating PO's, ambulated, no tongue deviation , smile symmetric. Palpable radial pulse, and grip 5/5  Discharge home f/u with Dr. Donnetta Hutching in 2 weeks.  Consults:    Significant Diagnostic Studies: CBC Lab Results  Component Value Date   WBC 6.7 12/14/2015   HGB 10.5 (L) 12/14/2015   HCT 33.0 (L) 12/14/2015   MCV 90.9 12/14/2015   PLT 119 (L) 12/14/2015    BMET     Component Value Date/Time   NA 139 12/14/2015 0500   K 3.5 12/14/2015 0500   CL 108 12/14/2015 0500   CO2 26 12/14/2015 0500   GLUCOSE 89 12/14/2015 0500   BUN 20 12/14/2015 0500   CREATININE 1.05 (H) 12/14/2015 0500   CALCIUM 8.5 (L) 12/14/2015 0500   GFRNONAA 50 (L) 12/14/2015 0500   GFRAA 57 (L) 12/14/2015 0500   COAG Lab Results  Component Value Date   INR 1.12 12/12/2015     Disposition:  Discharge to :Home Discharge Instructions    Call MD for:  redness, tenderness, or signs of infection (pain, swelling, bleeding, redness, odor or green/yellow discharge around incision site)    Complete by:  As directed    Call MD for:  severe or increased pain, loss or decreased feeling  in affected limb(s)    Complete by:  As directed    Call MD for:  temperature >100.5    Complete by:  As directed    Discharge instructions    Complete by:  As directed    You may shower this evening.   Driving Restrictions    Complete by:  As directed    No driving for 1 week   Increase activity slowly    Complete by:  As directed    Walk with assistance use walker or cane as needed   Lifting restrictions    Complete by:  As directed    No heavy lifting for 4 weeks   Resume previous diet    Complete by:  As directed  Medication List    TAKE these medications   aspirin EC 81 MG tablet Take 81 mg by mouth daily.   B-12 5000 MCG Caps Take 1 tablet by mouth daily.   [employer] MELATONIN 5 MG Caps Generic drug:  Melatonin Take 5 mg by mouth at bedtime as needed (sleep).   dicyclomine 10 MG capsule Commonly known as:  BENTYL Take 10 mg by mouth 4 (four) times daily as needed for spasms.   fenofibrate 160 MG tablet Take 160 mg by mouth daily.   lisinopril 10 MG tablet Commonly known as:  PRINIVIL,ZESTRIL Take 10 mg by mouth daily.   lisinopril 5 MG tablet Commonly known as:  PRINIVIL,ZESTRIL Take 5 mg by mouth at bedtime. Takes Prinzide in the morning and just lisinopril at  bedtime   lisinopril-hydrochlorothiazide 20-12.5 MG tablet Commonly known as:  PRINZIDE,ZESTORETIC Take 1 tablet by mouth daily. Takes Prinzide in the morning and just lisinopril at bedtime   omeprazole 20 MG capsule Commonly known as:  PRILOSEC Take 20 mg by mouth daily as needed (heartburn).   oxyCODONE-acetaminophen 5-325 MG tablet Commonly known as:  PERCOCET/ROXICET Take 1 tablet by mouth every 6 (six) hours as needed for moderate pain.   sertraline 50 MG tablet Commonly known as:  ZOLOFT Take 50 mg by mouth daily.      Verbal and written Discharge instructions given to the patient. Wound care per Discharge AVS Follow-up Information    Early, Todd, MD Follow up in 2 week(s).   Specialties:  Vascular Surgery, Cardiology Why:  office will call Contact information: [place] [id number] [phone]           Signed: Laurence Slate [facility] 12/14/2015, 9:51 AM --- For VQI Registry use --- Instructions: Press F2 to tab through selections.  Delete question if not applicable.   Modified Rankin score at D/C (0-6): Rankin Score=0  IV medication needed for:  1. Hypertension: No 2. Hypotension: No  Post-op Complications: No  1. Post-op CVA or TIA: No  If yes: Event classification (right eye, left eye, right cortical, left cortical, verterobasilar, other):   If yes: Timing of event (intra-op, <6 hrs post-op, >=6 hrs post-op, unknown):   2. CN injury: No  If yes: CN  injuried   3. Myocardial infarction: No  If yes: Dx by (EKG or clinical, Troponin):   4.  CHF: No  5.  Dysrhythmia (new): No  6. Wound infection: No  7. Reperfusion symptoms: No  8. Return to OR: No  If yes: return to OR for (bleeding, neurologic, other CEA incision, other):   Discharge medications: Statin use:  No  for medical reason   ASA use:  Yes Beta blocker use:  No  for medical reason   ACE-Inhibitor use:  Yes P2Y12 Antagonist use: [ x] None, [ ]  Plavix, [ ]   Plasugrel, [ ]  Ticlopinine, [ ]  Ticagrelor, [ ]  Other, [ ]  No for medical reason, [ ]  Non-compliant, [ ]  Not-indicated Anti-coagulant use:  [ x] None, [ ]  Warfarin, [ ]  Rivaroxaban, [ ]  Dabigatran, [ ]  Other, [ ]  No for medical reason, [ ]  Non-compliant, [ ]  Not-indicated

## 2015-12-14 NOTE — Progress Notes (Signed)
Discussed discharge instructions and medications with patient and patients granddaughter. Both verbalized understanding with all questions answered. VSS. Handouts given on stroke and carotid education.  Pt discharged home with granddaughter.  [facility]

## 2015-12-19 ENCOUNTER — Encounter: Payer: Self-pay | Admitting: Vascular Surgery

## 2015-12-20 ENCOUNTER — Emergency Department (HOSPITAL_COMMUNITY): Payer: Medicare Other

## 2015-12-20 ENCOUNTER — Encounter: Payer: Self-pay | Admitting: Family

## 2015-12-20 ENCOUNTER — Telehealth: Payer: Self-pay | Admitting: Vascular Surgery

## 2015-12-20 ENCOUNTER — Encounter (HOSPITAL_COMMUNITY): Payer: Self-pay | Admitting: *Deleted

## 2015-12-20 ENCOUNTER — Emergency Department (HOSPITAL_COMMUNITY)
Admission: EM | Admit: 2015-12-20 | Discharge: 2015-12-20 | Disposition: A | Discharge status: Home / Self Care | Payer: Medicare Other | Attending: Emergency Medicine | Admitting: Emergency Medicine

## 2015-12-20 ENCOUNTER — Ambulatory Visit (INDEPENDENT_AMBULATORY_CARE_PROVIDER_SITE_OTHER): Payer: Medicare Other | Admitting: Family

## 2015-12-20 VITALS — BP 142/79 | HR 63 | Temp 97.3°F | Resp 20 | Wt 143.0 lb

## 2015-12-20 DIAGNOSIS — R51 Headache: Secondary | ICD-10-CM

## 2015-12-20 DIAGNOSIS — I10 Essential (primary) hypertension: Secondary | ICD-10-CM | POA: Insufficient documentation

## 2015-12-20 DIAGNOSIS — I6521 Occlusion and stenosis of right carotid artery: Secondary | ICD-10-CM

## 2015-12-20 DIAGNOSIS — G8918 Other acute postprocedural pain: Secondary | ICD-10-CM | POA: Diagnosis not present

## 2015-12-20 DIAGNOSIS — Z79899 Other long term (current) drug therapy: Secondary | ICD-10-CM | POA: Diagnosis not present

## 2015-12-20 DIAGNOSIS — Z7982 Long term (current) use of aspirin: Secondary | ICD-10-CM | POA: Insufficient documentation

## 2015-12-20 DIAGNOSIS — R519 Headache, unspecified: Secondary | ICD-10-CM

## 2015-12-20 DIAGNOSIS — Z8673 Personal history of transient ischemic attack (TIA), and cerebral infarction without residual deficits: Secondary | ICD-10-CM | POA: Insufficient documentation

## 2015-12-20 LAB — CBC WITH DIFFERENTIAL/PLATELET
BASOS ABS: 0.1 10*3/uL (ref 0.0–0.1)
BASOS PCT: 1 %
EOS ABS: 0.2 10*3/uL (ref 0.0–0.7)
Eosinophils Relative: 2 %
HEMATOCRIT: 35.9 % — AB (ref 36.0–46.0)
HEMOGLOBIN: 11.8 g/dL — AB (ref 12.0–15.0)
Lymphocytes Relative: 12 %
Lymphs Abs: 1.1 10*3/uL (ref 0.7–4.0)
MCH: 29.7 pg (ref 26.0–34.0)
MCHC: 32.9 g/dL (ref 30.0–36.0)
MCV: 90.4 fL (ref 78.0–100.0)
MONOS PCT: 8 %
Monocytes Absolute: 0.8 10*3/uL (ref 0.1–1.0)
NEUTROS ABS: 7.7 10*3/uL (ref 1.7–7.7)
NEUTROS PCT: 79 %
Platelets: 204 10*3/uL (ref 150–400)
RBC: 3.97 MIL/uL (ref 3.87–5.11)
RDW: 13.6 % (ref 11.5–15.5)
WBC: 9.8 10*3/uL (ref 4.0–10.5)

## 2015-12-20 LAB — BASIC METABOLIC PANEL
ANION GAP: 11 (ref 5–15)
BUN: 30 mg/dL — ABNORMAL HIGH (ref 6–20)
CALCIUM: 9.9 mg/dL (ref 8.9–10.3)
CHLORIDE: 99 mmol/L — AB (ref 101–111)
CO2: 26 mmol/L (ref 22–32)
CREATININE: 1.4 mg/dL — AB (ref 0.44–1.00)
GFR calc non Af Amer: 35 mL/min — ABNORMAL LOW (ref >60)
GFR, EST AFRICAN AMERICAN: 41 mL/min — AB (ref >60)
Glucose, Bld: 150 mg/dL — ABNORMAL HIGH (ref 65–99)
Potassium: 3.6 mmol/L (ref 3.5–5.1)
SODIUM: 136 mmol/L (ref 135–145)

## 2015-12-20 MED ORDER — METHOCARBAMOL 500 MG PO TABS: 500.0000 mg | ORAL_TABLET | Freq: Two times a day (BID) | ORAL | 0 refills | Status: DC

## 2015-12-20 MED ORDER — HYDROCODONE-ACETAMINOPHEN 5-325 MG PO TABS: 2.0000 | ORAL_TABLET | ORAL | 0 refills | Status: DC | PRN

## 2015-12-20 MED ORDER — FENTANYL CITRATE (PF) 100 MCG/2ML IJ SOLN
50.0000 ug | INTRAMUSCULAR | Status: AC | PRN
  Administered 2015-12-20 (×2): 50 ug via INTRAVENOUS
  Filled 2015-12-20: qty 2

## 2015-12-20 MED ORDER — SODIUM CHLORIDE 0.9 % IV BOLUS (SEPSIS)
1000.0000 mL | Freq: Once | INTRAVENOUS | Status: AC
  Administered 2015-12-20: 1000 mL via INTRAVENOUS

## 2015-12-20 MED ORDER — ONDANSETRON HCL 4 MG/2ML IJ SOLN
4.0000 mg | Freq: Once | INTRAMUSCULAR | Status: AC
  Administered 2015-12-20: 4 mg via INTRAVENOUS

## 2015-12-20 MED ORDER — METHOCARBAMOL 500 MG PO TABS
500.0000 mg | ORAL_TABLET | Freq: Once | ORAL | Status: AC
  Administered 2015-12-20: 500 mg via ORAL
  Filled 2015-12-20: qty 1

## 2015-12-20 MED ORDER — ONDANSETRON HCL 4 MG/2ML IJ SOLN
INTRAMUSCULAR | Status: AC
  Filled 2015-12-20: qty 2

## 2015-12-20 MED ORDER — DEXAMETHASONE 4 MG PO TABS: 4.0000 mg | ORAL_TABLET | Freq: Two times a day (BID) | ORAL | 0 refills | Status: DC

## 2015-12-20 NOTE — ED Triage Notes (Signed)
Pt had left carotid surgery last week, having left side headache since Monday. Pt went to md office today and given meds but pain has still increased. Reports n/v on Saturday and then pain increased following that. No neuro deficits are noted at this time, other than reports unsteady gait.

## 2015-12-20 NOTE — Telephone Encounter (Signed)
Spoke with pt granddaughter Ashly this morning.  Her grandmother continues to complain of pain radiating to the jaw and neck area.  She is currently out of pain medication.  Her carotid endarterectomy was about a week ago.  I discussed with her that our office opens in about 40 min and she should be able to be seen today or tomorrow at the latest if the pain is concerning to her.  Ruta Hinds, MD [facility] [place] Office: [phone] Pager: [phone]

## 2015-12-20 NOTE — Patient Instructions (Signed)
Stroke Prevention Some medical conditions and behaviors are associated with an increased chance of having a stroke. You may prevent a stroke by making healthy choices and managing medical conditions. How can I reduce my risk of having a stroke?  Stay physically active. Get at least 30 minutes of activity on most or all days.  Do not smoke. It may also be helpful to avoid exposure to secondhand smoke.  Limit alcohol use. Moderate alcohol use is considered to be:  No more than 2 drinks per day for men.  No more than 1 drink per day for nonpregnant women.  Eat healthy foods. This involves:  Eating 5 or more servings of fruits and vegetables a day.  Making dietary changes that address high blood pressure (hypertension), high cholesterol, diabetes, or obesity.  Manage your cholesterol levels.  Making food choices that are high in fiber and low in saturated fat, trans fat, and cholesterol may control cholesterol levels.  Take any prescribed medicines to control cholesterol as directed by your health care provider.  Manage your diabetes.  Controlling your carbohydrate and sugar intake is recommended to manage diabetes.  Take any prescribed medicines to control diabetes as directed by your health care provider.  Control your hypertension.  Making food choices that are low in salt (sodium), saturated fat, trans fat, and cholesterol is recommended to manage hypertension.  Ask your health care provider if you need treatment to lower your blood pressure. Take any prescribed medicines to control hypertension as directed by your health care provider.  If you are 18-39 years of age, have your blood pressure checked every 3-5 years. If you are 40 years of age or older, have your blood pressure checked every year.  Maintain a healthy weight.  Reducing calorie intake and making food choices that are low in sodium, saturated fat, trans fat, and cholesterol are recommended to manage  weight.  Stop drug abuse.  Avoid taking birth control pills.  Talk to your health care provider about the risks of taking birth control pills if you are over 35 years old, smoke, get migraines, or have ever had a blood clot.  Get evaluated for sleep disorders (sleep apnea).  Talk to your health care provider about getting a sleep evaluation if you snore a lot or have excessive sleepiness.  Take medicines only as directed by your health care provider.  For some people, aspirin or blood thinners (anticoagulants) are helpful in reducing the risk of forming abnormal blood clots that can lead to stroke. If you have the irregular heart rhythm of atrial fibrillation, you should be on a blood thinner unless there is a good reason you cannot take them.  Understand all your medicine instructions.  Make sure that other conditions (such as anemia or atherosclerosis) are addressed. Get help right away if:  You have sudden weakness or numbness of the face, arm, or leg, especially on one side of the body.  Your face or eyelid droops to one side.  You have sudden confusion.  You have trouble speaking (aphasia) or understanding.  You have sudden trouble seeing in one or both eyes.  You have sudden trouble walking.  You have dizziness.  You have a loss of balance or coordination.  You have a sudden, severe headache with no known cause.  You have new chest pain or an irregular heartbeat. Any of these symptoms may represent a serious problem that is an emergency. Do not wait to see if the symptoms will go away.   Get medical help at once. Call your local emergency services (911 in [place] Do not drive yourself to the hospital. This information is not intended to replace advice given to you by your health care provider. Make sure you discuss any questions you have with your health care provider. Document Released: 02/01/2004 Document Revised: 06/01/2015 Document Reviewed: 06/26/2012 Elsevier  Interactive Patient Education  2017 [employer]  
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
## 2015-12-20 NOTE — Progress Notes (Addendum)
Postoperative Visit   History of Present Illness  Carolyn [name] is a 80 y.o. female who is s/p right carotid endarterectomy with Dacron patch angioplasty on 12-13-15 by Dr. Donnetta Hutching.  She had an episode several weeks prior to the right CEA of very clear-cut total blindness in her right eye which was temporary and resolved completely. She is right-handed. She did not have any left body symptoms. Had no prior episodes of visual changes or weakness. She is quite healthy.  She returns today after Dr. Oneida Alar spoke with pt granddaughter Ashly this morning. The patient complains of pain radiating from both sides of her neck to the top of her head.  She is currently out of pain medication. She reports se has a little trouble swallowing, helps to drink fluids through a straw, does not seem to choke on her fluids, does not seem to have trouble swallowing solids.   She is scheduled for 2 weeks post op follow up with a PA on 12-26-15.  The patient's neck incision is healed.  The patient has had no stroke or TIA symptoms since the right CEA.  For VQI Use Only  PRE-ADM LIVING: Home  AMB STATUS: Ambulatory  Social History   Social History  . Marital status: Married    Spouse name: N/A  . Number of children: N/A  . Years of education: N/A   Occupational History  . Not on file.   Social History Main Topics  . Smoking status: Never Smoker  . Smokeless tobacco: Never Used  . Alcohol use No  . Drug use: No  . Sexual activity: Not on file   Other Topics Concern  . Not on file   Social History Narrative  . No narrative on file   Allergies  Allergen Reactions  . Penicillins Hives     Has patient had a PCN reaction causing immediate rash, facial/tongue/throat swelling, SOB or lightheadedness with hypotension:    # # YES # #  Has patient had a PCN reaction causing severe rash involving mucus membranes or skin necrosis: No Has patient had a PCN reaction that required hospitalization  No Has patient had a PCN reaction occurring within the last 10 years: No If all of the above answers are "NO", then may proceed with Cephalosporin use.      Current Outpatient Prescriptions on File Prior to Visit  Medication Sig Dispense Refill  . aspirin EC 81 MG tablet Take 81 mg by mouth daily.    . Cyanocobalamin (B-12) 5000 MCG CAPS Take 1 tablet by mouth daily.    Marland Kitchen dicyclomine (BENTYL) 10 MG capsule Take 10 mg by mouth 4 (four) times daily as needed for spasms.     . fenofibrate 160 MG tablet Take 160 mg by mouth daily.    Marland Kitchen lisinopril (PRINIVIL,ZESTRIL) 10 MG tablet Take 10 mg by mouth daily.    Marland Kitchen lisinopril (PRINIVIL,ZESTRIL) 5 MG tablet Take 5 mg by mouth at bedtime. Takes Prinzide in the morning and just lisinopril at bedtime    . lisinopril-hydrochlorothiazide (PRINZIDE,ZESTORETIC) 20-12.5 MG tablet Take 1 tablet by mouth daily. Takes Prinzide in the morning and just lisinopril at bedtime    . Melatonin (CVS MELATONIN) 5 MG CAPS Take 5 mg by mouth at bedtime as needed (sleep).    Marland Kitchen omeprazole (PRILOSEC) 20 MG capsule Take 20 mg by mouth daily as needed (heartburn).     Marland Kitchen oxyCODONE-acetaminophen (PERCOCET/ROXICET) 5-325 MG tablet Take 1 tablet by mouth every 6 (six)  hours as needed for moderate pain. 6 tablet 0  . sertraline (ZOLOFT) 50 MG tablet Take 50 mg by mouth daily.     No current facility-administered medications on file prior to visit.     Physical Examination  Vitals:   12/20/15 1514 12/20/15 1520  BP: (!) 149/72 (!) 142/79  Pulse: 64 63  Resp: 20   Temp: 97.3 F (36.3 C)   SpO2: 94%   Weight: 143 lb (64.9 kg)    Body mass index is 24.55 kg/m.  Right side Neck: Incision is healed Neuro: CN 2-12 are intact, Motor strength is 5/5 bilaterally, sensation is grossly intact  Medical Decision Making  Carolyn [name] is a 80 y.o. female who presents s/p right CEA on 12-13-15. She had a preoperative right occular stroke, no stroke or TIA subsequently.  I  discussed her bilateral neck pain and headaches with Dr. Scot Dock, dexamethasone 4 mg po every 6 hours x 4 doses only for post perfusion headaches. I advised pt to call us by Friday morning, 12-22-15, let us know how she is doing. I also advised if the headache gets worse to get to an ED.  If headache does not improve will consider CT of head.  She is scheduled for 2 weeks post op follow up with a PA on 12-26-15, Dr. Donnetta Hutching is in the office this date, advised to keep this appointment.  Her grand-daughter called after they left the office, stated her headache is such that the grand-daughter will take her to the ED.  The patient is currently on an antiplatelet: 81 mg ASA. The patient is currently not on a statin.  Thank you for allowing [place] to participate in this patient's care.  Kaelin Holford, Sharmon Leyden, RN, MSN, FNP-C [facility] Office: [phone]  12/20/2015, 3:20 PM  Clinic MD: Scot Dock

## 2015-12-20 NOTE — ED Notes (Signed)
Patient transported to CT 

## 2015-12-26 ENCOUNTER — Encounter: Payer: Self-pay | Admitting: Vascular Surgery

## 2015-12-26 ENCOUNTER — Ambulatory Visit (INDEPENDENT_AMBULATORY_CARE_PROVIDER_SITE_OTHER): Payer: Medicare Other | Admitting: Vascular Surgery

## 2015-12-26 VITALS — BP 129/72 | HR 61 | Temp 99.0°F | Resp 16 | Ht 64.0 in | Wt 145.6 lb

## 2015-12-26 DIAGNOSIS — I6521 Occlusion and stenosis of right carotid artery: Secondary | ICD-10-CM

## 2015-12-26 NOTE — Progress Notes (Signed)
Patient name: Carolyn [name] MRN: [id number] DOB: [birth year] Sex: female  REASON FOR VISIT: post-op  HPI: Carolyn [name] is a 80 y.o. female who presents for postoperative follow-up status post right carotid endarterectomy for symptomatic right carotid stenosis on 12/13/2015. The patient had an episode of total blindness in her right eye which resolved completely. She denies any amaurosis fugax, sudden onset weakness or numbness in the extremities expressive aphasia.  She was seen our office on 12/20/15 by [name] secondary to pain radiating from both sides of her neck to the top of her head. She reported that she was out of pain medication. Dr. Scot Dock was present in the office at that time and recommended dexamethasone 4 mg every 6 hours 4 doses for post perfusion headaches. Later that day, her granddaughter called stating that her headache persisted and went to the emergency department.  Her CT of head was without acute abnormality. Her neuro exam was intact. Her symptoms were felt to be consistent with musculoskeletal etiology.  Today, the pain at her posterior right head is very mild. She denies any neck pain. She denies any issues with swallowing.  She is on aspirin and fenofibrate daily.  Current Outpatient Prescriptions  Medication Sig Dispense Refill  . aspirin EC 81 MG tablet Take 81 mg by mouth daily.    Marland Kitchen dexamethasone (DECADRON) 4 MG tablet Take 1 tablet (4 mg total) by mouth 2 (two) times daily with a meal. Take 1 tablet by mouth ever 6 hours for a total of four doses 4 tablet 0  . dicyclomine (BENTYL) 10 MG capsule Take 10 mg by mouth 4 (four) times daily as needed for spasms.     . fenofibrate 160 MG tablet Take 160 mg by mouth daily.    Marland Kitchen HYDROcodone-acetaminophen (NORCO/VICODIN) 5-325 MG tablet Take 2 tablets by mouth every 4 (four) hours as needed. 10 tablet 0  . lisinopril (PRINIVIL,ZESTRIL) 5 MG tablet Take 5 mg by mouth at bedtime. Takes Prinzide in the  morning and just lisinopril at bedtime    . lisinopril-hydrochlorothiazide (PRINZIDE,ZESTORETIC) 20-12.5 MG tablet Take 1 tablet by mouth daily. Takes Prinzide in the morning and just lisinopril at bedtime    . Melatonin (CVS MELATONIN) 5 MG CAPS Take 5 mg by mouth at bedtime as needed (sleep).    . methocarbamol (ROBAXIN) 500 MG tablet Take 1 tablet (500 mg total) by mouth 2 (two) times daily. 20 tablet 0  . omeprazole (PRILOSEC) 20 MG capsule Take 20 mg by mouth daily as needed (heartburn).     Marland Kitchen oxyCODONE-acetaminophen (PERCOCET/ROXICET) 5-325 MG tablet Take 1 tablet by mouth every 6 (six) hours as needed for moderate pain. 6 tablet 0  . sertraline (ZOLOFT) 50 MG tablet Take 50 mg by mouth daily.     No current facility-administered medications for this visit.     REVIEW OF SYSTEMS:  [X]  denotes positive finding, [ ]  denotes negative finding Cardiac  Comments:  Chest pain or chest pressure:    Shortness of breath upon exertion:    Short of breath when lying flat:    Irregular heart rhythm:    Constitutional    Fever or chills:      PHYSICAL EXAM: There were no vitals filed for this visit.  GENERAL: The patient is a well-nourished female, in no acute distress. The vital signs are documented above. CARDIOVASCULAR: There is a regular rate and rhythm.No carotid bruits.  PULMONARY: There is good air exchange bilaterally without wheezing  or rales. VASCULAR: Right neck incision healing well. Tongue is midline, mild asymmetry. 5 out of 5 strength upper and lower extremities bilaterally. Some mild numbness around incision and right jaw.   MEDICAL ISSUES: Status post right carotid endarterectomy  Patient is doing well postop. 6 days ago, she had a right-sided headache. Workup in the ED was negative for stroke. Her symptoms have significantly improved since then. She denies any TIA or stroke symptoms. She has less than 40% stenosis of the left internal carotid artery. She is on maximal  medical management with aspirin and fenofibrate. She will follow up in 6 months with carotid duplex studies. She knows to call our office or seek emergency assistance if she develops any new neurological symptoms.    Virgina Jock, PA-C [facility]

## 2015-12-27 NOTE — Addendum Note (Signed)
Addended by: Lianne Cure A on: 12/27/2015 09:13 AM   Modules accepted: Orders

## 2016-01-08 HISTORY — PX: SHOULDER SURGERY: SHX246

## 2016-01-09 ENCOUNTER — Encounter: Payer: Self-pay | Admitting: Vascular Surgery

## 2016-02-05 ENCOUNTER — Encounter: Payer: Self-pay | Admitting: Vascular Surgery

## 2016-02-12 ENCOUNTER — Other Ambulatory Visit: Payer: Self-pay | Admitting: *Deleted

## 2016-02-12 ENCOUNTER — Ambulatory Visit (INDEPENDENT_AMBULATORY_CARE_PROVIDER_SITE_OTHER): Payer: Medicare Other | Admitting: Vascular Surgery

## 2016-02-12 VITALS — BP 147/74 | HR 60 | Temp 98.5°F | Resp 18 | Ht 64.0 in | Wt 143.0 lb

## 2016-02-12 DIAGNOSIS — I83812 Varicose veins of left lower extremities with pain: Secondary | ICD-10-CM

## 2016-02-12 DIAGNOSIS — I83892 Varicose veins of left lower extremities with other complications: Secondary | ICD-10-CM | POA: Diagnosis not present

## 2016-02-12 NOTE — Progress Notes (Signed)
Subjective:     Patient ID: Carolyn [name], female   DOB: 11/28/1935, 81 y.o.   MRN: [id number]  HPI This 81 year old female returns for further evaluation of her painful varicosities in the left leg. She has tried long leg elastic compression stockings 20-30 millimeter gradient as well as elevation and ibuprofen but continues to have aching throbbing and burning discomfort as well as shooting pains in the left leg which worsens or she is on her feet. She was found to have a severe right carotid stenosis causing her amaurosis fugax and successfully underwent right carotid endarterectomy by Dr. early a few months ago with a great result. She does feel that these symptoms are affecting her daily living and would like treatment. She has no history of DVT or thrombophlebitis.  Past Medical History:  Diagnosis Date  . C. difficile diarrhea 2004  . Carotid artery narrowing   . GERD (gastroesophageal reflux disease)   . Hypertension   . Pneumonia    x3 times  . TIA (transient ischemic attack)    "not sure, mentioned in past and recently"    Social History  Substance Use Topics  . Smoking status: Never Smoker  . Smokeless tobacco: Never Used  . Alcohol use No    No family history on file.  Allergies  Allergen Reactions  . Penicillins Hives     Has patient had a PCN reaction causing immediate rash, facial/tongue/throat swelling, SOB or lightheadedness with hypotension:    # # YES # #  Has patient had a PCN reaction causing severe rash involving mucus membranes or skin necrosis: No Has patient had a PCN reaction that required hospitalization No Has patient had a PCN reaction occurring within the last 10 years: No If all of the above answers are "NO", then may proceed with Cephalosporin use.      Current Outpatient Prescriptions:  .  aspirin EC 81 MG tablet, Take 81 mg by mouth daily., Disp: , Rfl:  .  dexamethasone (DECADRON) 4 MG tablet, Take 1 tablet (4 mg total) by mouth 2 (two)  times daily with a meal. Take 1 tablet by mouth ever 6 hours for a total of four doses, Disp: 4 tablet, Rfl: 0 .  dicyclomine (BENTYL) 10 MG capsule, Take 10 mg by mouth 4 (four) times daily as needed for spasms. , Disp: , Rfl:  .  fenofibrate 160 MG tablet, Take 160 mg by mouth daily., Disp: , Rfl:  .  HYDROcodone-acetaminophen (NORCO/VICODIN) 5-325 MG tablet, Take 2 tablets by mouth every 4 (four) hours as needed., Disp: 10 tablet, Rfl: 0 .  lisinopril (PRINIVIL,ZESTRIL) 5 MG tablet, Take 5 mg by mouth at bedtime. Takes Prinzide in the morning and just lisinopril at bedtime, Disp: , Rfl:  .  lisinopril-hydrochlorothiazide (PRINZIDE,ZESTORETIC) 20-12.5 MG tablet, Take 1 tablet by mouth daily. Takes Prinzide in the morning and just lisinopril at bedtime, Disp: , Rfl:  .  Melatonin (CVS MELATONIN) 5 MG CAPS, Take 5 mg by mouth at bedtime as needed (sleep)., Disp: , Rfl:  .  methocarbamol (ROBAXIN) 500 MG tablet, Take 1 tablet (500 mg total) by mouth 2 (two) times daily., Disp: 20 tablet, Rfl: 0 .  omeprazole (PRILOSEC) 20 MG capsule, Take 20 mg by mouth daily as needed (heartburn). , Disp: , Rfl:  .  oxyCODONE-acetaminophen (PERCOCET/ROXICET) 5-325 MG tablet, Take 1 tablet by mouth every 6 (six) hours as needed for moderate pain., Disp: 6 tablet, Rfl: 0 .  sertraline (ZOLOFT) 50 MG  tablet, Take 50 mg by mouth daily., Disp: , Rfl:   Vitals:   02/12/16 1024  BP: (!) 147/74  Pulse: 60  Resp: 18  Temp: 98.5 F (36.9 C)  TempSrc: Oral  SpO2: 97%  Weight: 143 lb (64.9 kg)  Height: 5\' 4"  (1.626 m)    Body mass index is 24.55 kg/m.         Review of Systems Denies chest pain, dyspnea on exertion, PND, orthopnea, hemoptysis. No recurrent visual symptoms since her carotid surgery.    Objective:   Physical Exam BP (!) 147/74 (BP Location: Left Arm, Patient Position: Sitting, Cuff Size: Normal)   Pulse 60   Temp 98.5 F (36.9 C) (Oral)   Resp 18   Ht 5\' 4"  (1.626 m)   Wt 143 lb (64.9  kg)   SpO2 97%   BMI 24.55 kg/m   Gen. well-developed well-nourished female no apparent distress alert and oriented 3 Lungs no rhonchi or wheezing Neurologic exam normal Left leg with bulging varicosities in the medial thigh and lateral calf with hyperpigmentation lower third left leg and 1+ edema distally. 3+ dorsalis pedis pulse palpable. No active ulcer noted.  Venous duplex exam performed in October 2017 revealed gross reflux and large left great saphenous vein supplying these painful varicosities with no DVT     Assessment:     Painful varicosities left leg due to gross reflux left great saphenous vein causing symptoms which are affecting patient's daily living and resistant to conservative measures including long-leg elastic compression stockings 20-30 millimeter gradient, elevation, and ibuprofen Status post right carotid endarterectomy for severe stenosis with amaurosis fugax-symptoms now resolved    Plan:     Patient needs #1 laser ablation left great saphenous vein followed by three-month waiting. 2 then determine if stab phlebectomy of secondary painful varicosities will be indicated We will proceed with precertification to perform this in the near future to relieve her symptoms

## 2016-03-05 ENCOUNTER — Encounter: Payer: Self-pay | Admitting: *Deleted

## 2016-03-07 ENCOUNTER — Encounter (INDEPENDENT_AMBULATORY_CARE_PROVIDER_SITE_OTHER): Payer: Medicare Other | Admitting: Ophthalmology

## 2016-03-07 DIAGNOSIS — H35372 Puckering of macula, left eye: Secondary | ICD-10-CM

## 2016-03-07 DIAGNOSIS — H43813 Vitreous degeneration, bilateral: Secondary | ICD-10-CM

## 2016-03-07 DIAGNOSIS — I10 Essential (primary) hypertension: Secondary | ICD-10-CM

## 2016-03-07 DIAGNOSIS — H2513 Age-related nuclear cataract, bilateral: Secondary | ICD-10-CM | POA: Diagnosis not present

## 2016-03-07 DIAGNOSIS — H35033 Hypertensive retinopathy, bilateral: Secondary | ICD-10-CM

## 2016-03-15 ENCOUNTER — Encounter: Payer: Self-pay | Admitting: Vascular Surgery

## 2016-03-20 ENCOUNTER — Encounter: Payer: Self-pay | Admitting: Vascular Surgery

## 2016-03-25 ENCOUNTER — Encounter: Payer: Self-pay | Admitting: Vascular Surgery

## 2016-03-25 ENCOUNTER — Ambulatory Visit (INDEPENDENT_AMBULATORY_CARE_PROVIDER_SITE_OTHER): Payer: Medicare Other | Admitting: Vascular Surgery

## 2016-03-25 VITALS — BP 159/72 | HR 51 | Temp 98.7°F | Resp 18 | Ht 64.0 in | Wt 146.0 lb

## 2016-03-25 DIAGNOSIS — I83892 Varicose veins of left lower extremities with other complications: Secondary | ICD-10-CM | POA: Diagnosis not present

## 2016-03-25 HISTORY — PX: ENDOVENOUS ABLATION SAPHENOUS VEIN W/ LASER: SUR449

## 2016-03-25 NOTE — Progress Notes (Signed)
Laser Ablation Procedure    Date: 03/25/2016   Carolyn [name] DOB:December 08, 1935  Consent signed: Yes    Surgeon:  Dr. Nelda Severe. Kellie Simmering  Procedure: Laser Ablation: left Greater Saphenous Vein  BP (!) 159/72 (BP Location: Left Arm) Comment: recheck  Pulse (!) 51   Temp 98.7 F (37.1 C) (Oral)   Resp 18   Ht 5\' 4"  (1.626 m)   Wt 146 lb (66.2 kg)   SpO2 95%   BMI 25.06 kg/m   Tumescent Anesthesia: 300 cc 0.9% NaCl with 50 cc Lidocaine HCL with 1% Epi and 15 cc 8.4% NaHCO3  Local Anesthesia: 3 cc Lidocaine HCL and NaHCO3 (ratio 2:1)  Pulsed Mode: 15 watts, 536ms delay, 1.0 duration  Total Energy: 1415 Joules             Total Pulses: 95               Total Time: 1:35      Patient tolerated procedure well    Description of Procedure:  After marking the course of the secondary varicosities, the patient was placed on the operating table in the supine position, and the left leg was prepped and draped in sterile fashion.   Local anesthetic was administered and under ultrasound guidance the saphenous vein was accessed with a micro needle and guide wire; then the mirco puncture sheath was placed.  A guide wire was inserted saphenofemoral junction , followed by a 5 french sheath.  The position of the sheath and then the laser fiber below the junction was confirmed using the ultrasound.  Tumescent anesthesia was administered along the course of the saphenous vein using ultrasound guidance. The patient was placed in Trendelenburg position and protective laser glasses were placed on patient and staff, and the laser was fired at 15 watts continuous mode advancing 1-15mm/second for a total of 1415 joules.       Steri strip was applied to IV insertion site and ABD pads and thigh high compression stockings were applied.  Ace wrap bandages were applied over left thigh and at the top of the saphenofemoral junction. Blood loss was less than 15 cc.  The patient ambulated out of the operating room  having tolerated the procedure well.

## 2016-03-25 NOTE — Progress Notes (Signed)
Subjective:     Patient ID: Carolyn [name], female   DOB: 19-Jan-1935, 81 y.o.   MRN: [id number]  HPI This 81 year old female had laser ablation left great saphenous vein performed under local tumescent anesthesia. A total of 1415 J of energy was utilized. She tolerated the procedure well.  Review of Systems     Objective:   Physical Exam BP (!) 159/72 (BP Location: Left Arm) Comment: recheck  Pulse (!) 51   Temp 98.7 F (37.1 C) (Oral)   Resp 18   Ht 5\' 4"  (1.626 m)   Wt 146 lb (66.2 kg)   SpO2 95%   BMI 25.06 kg/m        Assessment:     Well-tolerated laser ablation left great saphenous vein performed under local tumescent anesthesia    Plan:     Return in 1 week for venous duplex exam confirmed closure left great saphenous vein

## 2016-04-02 ENCOUNTER — Encounter: Payer: Self-pay | Admitting: Vascular Surgery

## 2016-04-02 ENCOUNTER — Ambulatory Visit (INDEPENDENT_AMBULATORY_CARE_PROVIDER_SITE_OTHER): Payer: Medicare Other | Admitting: Vascular Surgery

## 2016-04-02 ENCOUNTER — Ambulatory Visit (HOSPITAL_COMMUNITY)
Admission: RE | Admit: 2016-04-02 | Discharge: 2016-04-02 | Disposition: A | Discharge status: Home / Self Care | Payer: Medicare Other | Source: Ambulatory Visit | Attending: Vascular Surgery | Admitting: Vascular Surgery

## 2016-04-02 VITALS — BP 134/68 | HR 62 | Temp 97.8°F | Ht 63.0 in | Wt 140.0 lb

## 2016-04-02 DIAGNOSIS — I82492 Acute embolism and thrombosis of other specified deep vein of left lower extremity: Secondary | ICD-10-CM | POA: Diagnosis not present

## 2016-04-02 DIAGNOSIS — I83812 Varicose veins of left lower extremities with pain: Secondary | ICD-10-CM | POA: Diagnosis not present

## 2016-04-02 DIAGNOSIS — I83892 Varicose veins of left lower extremities with other complications: Secondary | ICD-10-CM | POA: Diagnosis not present

## 2016-04-02 NOTE — Progress Notes (Signed)
Subjective:     Patient ID: Carolyn [name], female   DOB: April 18, 1935, 81 y.o.   MRN: [id number]  HPI This 81 year old female returns 1 week post-laser ablation left great saphenous vein for painful varicosities and swelling left leg. She has had some mild discomfort along the course of the great saphenous vein but nothing severe she states. She did take her ibuprofen as instructed and wore her long leg elastic compression stocking. She states that there has been some improvement in the swelling distally. She is having no pain currently.  Past Medical History:  Diagnosis Date  . C. difficile diarrhea 2004  . Carotid artery narrowing   . GERD (gastroesophageal reflux disease)   . Hypertension   . Pneumonia    x3 times  . TIA (transient ischemic attack)    "not sure, mentioned in past and recently"    Social History  Substance Use Topics  . Smoking status: Never Smoker  . Smokeless tobacco: Never Used  . Alcohol use No    No family history on file.  Allergies  Allergen Reactions  . Penicillins Hives     Has patient had a PCN reaction causing immediate rash, facial/tongue/throat swelling, SOB or lightheadedness with hypotension:    # # YES # #  Has patient had a PCN reaction causing severe rash involving mucus membranes or skin necrosis: No Has patient had a PCN reaction that required hospitalization No Has patient had a PCN reaction occurring within the last 10 years: No If all of the above answers are "NO", then may proceed with Cephalosporin use.      Current Outpatient Prescriptions:  .  aspirin EC 81 MG tablet, Take 81 mg by mouth daily., Disp: , Rfl:  .  dexamethasone (DECADRON) 4 MG tablet, Take 1 tablet (4 mg total) by mouth 2 (two) times daily with a meal. Take 1 tablet by mouth ever 6 hours for a total of four doses, Disp: 4 tablet, Rfl: 0 .  dicyclomine (BENTYL) 10 MG capsule, Take 10 mg by mouth 4 (four) times daily as needed for spasms. , Disp: , Rfl:  .   fenofibrate 160 MG tablet, Take 160 mg by mouth daily., Disp: , Rfl:  .  lisinopril (PRINIVIL,ZESTRIL) 5 MG tablet, Take 5 mg by mouth at bedtime. Takes Prinzide in the morning and just lisinopril at bedtime, Disp: , Rfl:  .  lisinopril-hydrochlorothiazide (PRINZIDE,ZESTORETIC) 20-12.5 MG tablet, Take 1 tablet by mouth daily. Takes Prinzide in the morning and just lisinopril at bedtime, Disp: , Rfl:  .  omeprazole (PRILOSEC) 20 MG capsule, Take 20 mg by mouth daily as needed (heartburn). , Disp: , Rfl:  .  sertraline (ZOLOFT) 50 MG tablet, Take 50 mg by mouth daily., Disp: , Rfl:   Vitals:   04/02/16 1018  BP: 134/68  Pulse: 62  Temp: 97.8 F (36.6 C)  TempSrc: Oral  Weight: 140 lb (63.5 kg)  Height: 5\' 3"  (1.6 m)    Body mass index is 24.8 kg/m.         Review of Systems Chest pain, dyspnea on exertion, PND, orthopnea, hemoptysis. Previous TIAs have resolved following carotid surgery.    Objective:   Physical Exam BP 134/68   Pulse 62   Temp 97.8 F (36.6 C) (Oral)   Ht 5\' 3"  (1.6 m)   Wt 140 lb (63.5 kg)   BMI 24.80 kg/m   Well-developed well-nourished female no apparent distress alert and oriented 3 Lungs no rhonchi or  wheezing Left leg with mild tenderness to deep palpation over great saphenous vein in mid to proximal thigh. No distal edema noted. One prominent varix and posterior calf but other varicosities are much less prominent since laser ablation procedure. No active ulcer. Early hyperpigmentation distally.  Today I ordered a venous duplex exam which I reviewed and interpreted. There is no DVT. There is total closure of the left great saphenous vein up to near the saphenofemoral junction     Assessment:     Successful laser ablation left great saphenous vein with improvement in pain and swelling and decompression of varicosities    Plan:     Return to see me on a when necessary basis no further procedures anticipated at this time

## 2016-05-07 ENCOUNTER — Encounter (INDEPENDENT_AMBULATORY_CARE_PROVIDER_SITE_OTHER): Payer: Medicare Other | Admitting: Ophthalmology

## 2016-05-07 ENCOUNTER — Encounter (INDEPENDENT_AMBULATORY_CARE_PROVIDER_SITE_OTHER): Payer: Self-pay

## 2016-07-09 ENCOUNTER — Ambulatory Visit: Payer: Self-pay | Admitting: Family

## 2016-07-09 ENCOUNTER — Encounter (HOSPITAL_COMMUNITY): Payer: Self-pay

## 2016-07-30 ENCOUNTER — Encounter: Payer: Self-pay | Admitting: Vascular Surgery

## 2016-08-14 ENCOUNTER — Ambulatory Visit (HOSPITAL_COMMUNITY)
Admission: RE | Admit: 2016-08-14 | Discharge: 2016-08-14 | Disposition: A | Discharge status: Home / Self Care | Payer: Medicare Other | Source: Ambulatory Visit | Attending: Vascular Surgery | Admitting: Vascular Surgery

## 2016-08-14 ENCOUNTER — Ambulatory Visit (INDEPENDENT_AMBULATORY_CARE_PROVIDER_SITE_OTHER): Payer: Medicare Other | Admitting: Vascular Surgery

## 2016-08-14 ENCOUNTER — Encounter: Payer: Self-pay | Admitting: Vascular Surgery

## 2016-08-14 VITALS — BP 202/8 | HR 55 | Temp 97.9°F | Resp 18 | Ht 63.0 in | Wt 143.8 lb

## 2016-08-14 DIAGNOSIS — I6521 Occlusion and stenosis of right carotid artery: Secondary | ICD-10-CM | POA: Insufficient documentation

## 2016-08-14 LAB — VAS US CAROTID
LCCADDIAS: 14 cm/s
LCCADSYS: 62 cm/s
LCCAPDIAS: 13 cm/s
LCCAPSYS: 86 cm/s
LEFT ECA DIAS: -7 cm/s
LICADSYS: -70 cm/s
Left ICA dist dias: -18 cm/s
Left ICA prox dias: -19 cm/s
Left ICA prox sys: -89 cm/s
RCCADSYS: -72 cm/s
RCCAPDIAS: -6 cm/s
RIGHT CCA MID DIAS: 6 cm/s
RIGHT ECA DIAS: -7 cm/s
Right CCA prox sys: -57 cm/s

## 2016-08-14 NOTE — Progress Notes (Signed)
[facility] [place]  Patient name: Carolyn [name] MRN: [id number] DOB: 02/07/1935 Sex: female  REASON FOR VISIT: Follow-up right carotid endarterectomy for symptomatic carotid disease on 12/13/2015  HPI: Carolyn [name] is a 81 y.o. female here for follow-up. She is done quite well since her surgery. She has had cataract surgery bilaterally had no more episodes of amaurosis fugax. She's had no other TIA or stroke  Past Medical History:  Diagnosis Date  . C. difficile diarrhea 2004  . Carotid artery narrowing   . GERD (gastroesophageal reflux disease)   . Hypertension   . Pneumonia    x3 times  . TIA (transient ischemic attack)    "not sure, mentioned in past and recently"    Family History  Problem Relation Age of Onset  . Heart disease Son     SOCIAL HISTORY: Social History  Substance Use Topics  . Smoking status: Never Smoker  . Smokeless tobacco: Never Used  . Alcohol use No    Allergies  Allergen Reactions  . Penicillins Hives     Has patient had a PCN reaction causing immediate rash, facial/tongue/throat swelling, SOB or lightheadedness with hypotension:    # # YES # #  Has patient had a PCN reaction causing severe rash involving mucus membranes or skin necrosis: No Has patient had a PCN reaction that required hospitalization No Has patient had a PCN reaction occurring within the last 10 years: No If all of the above answers are "NO", then may proceed with Cephalosporin use.     Current Outpatient Prescriptions  Medication Sig Dispense Refill  . aspirin EC 81 MG tablet Take 81 mg by mouth daily.    Marland Kitchen dexamethasone (DECADRON) 4 MG tablet Take 1 tablet (4 mg total) by mouth 2 (two) times daily with a meal. Take 1 tablet by mouth ever 6 hours for a total of four doses 4 tablet 0  . dicyclomine (BENTYL) 10 MG capsule Take 10 mg by mouth 4 (four) times daily as needed for spasms.     . fenofibrate 160  MG tablet Take 160 mg by mouth daily.    Marland Kitchen lisinopril (PRINIVIL,ZESTRIL) 5 MG tablet Take 5 mg by mouth at bedtime. Takes Prinzide in the morning and just lisinopril at bedtime    . lisinopril-hydrochlorothiazide (PRINZIDE,ZESTORETIC) 20-12.5 MG tablet Take 1 tablet by mouth daily. Takes Prinzide in the morning and just lisinopril at bedtime    . omeprazole (PRILOSEC) 20 MG capsule Take 20 mg by mouth daily as needed (heartburn).     . sertraline (ZOLOFT) 50 MG tablet Take 50 mg by mouth daily.     No current facility-administered medications for this visit.     REVIEW OF SYSTEMS:  [X]  denotes positive finding, [ ]  denotes negative finding Cardiac  Comments:  Chest pain or chest pressure:    Shortness of breath upon exertion:    Short of breath when lying flat:    Irregular heart rhythm:        Vascular    Pain in calf, thigh, or hip brought on by ambulation:    Pain in feet at night that wakes you up from your sleep:     Blood clot in your veins:    Leg swelling:           PHYSICAL EXAM: Vitals:   08/14/16 1016 08/14/16 1019  BP: (!) 196/83 (!) 202/8  Pulse: (!) 55   Resp: 18   Temp:  97.9 F (36.6 C)   TempSrc: Oral   SpO2: 98%   Weight: 143 lb 12.8 oz (65.2 kg)   Height: 5\' 3"  (1.6 m)     GENERAL: The patient is a well-nourished female, in no acute distress. The vital signs are documented above. CARDIOVASCULAR: Right carotid incision is well-healed without bruit. 2+ radial pulses bilaterally PULMONARY: There is good air exchange  MUSCULOSKELETAL: There are no major deformities or cyanosis. NEUROLOGIC: No focal weakness or paresthesias are detected. SKIN: There are no ulcers or rashes noted. PSYCHIATRIC: The patient has a normal affect.  DATA:  Carotid duplex today reveals widely patent endarterectomy on the right and no evidence of significant stenosis or left carotid system  MEDICAL ISSUES: Stable overall. Will like to continue usual activities. We will notify us  or present to the emergency department for any neurologic deficits. Will see [place] again in 6 months with repeat carotid duplex. Assuming this is normal then drop back to yearly carotid surveillance    Rosetta Posner, MD [facility] Tel [phone] Pager [phone]

## 2016-08-16 ENCOUNTER — Telehealth: Payer: Self-pay | Admitting: Vascular Surgery

## 2016-08-16 NOTE — Telephone Encounter (Signed)
Per instructions from Dr.Early, I referred this patient to [facility] And Angie at their office scheduled an appt for her to see Dr.Holwerda on 09/19/16 at 9:30am. I left a message for the patient regarding this appt. awt

## 2016-08-21 NOTE — Addendum Note (Signed)
Addended by: Lianne Cure A on: 08/21/2016 01:21 PM   Modules accepted: Orders

## 2017-02-18 ENCOUNTER — Ambulatory Visit: Payer: Medicare Other | Admitting: Family

## 2017-02-18 ENCOUNTER — Other Ambulatory Visit: Payer: Self-pay

## 2017-02-18 ENCOUNTER — Encounter: Payer: Self-pay | Admitting: Family

## 2017-02-18 ENCOUNTER — Ambulatory Visit (HOSPITAL_COMMUNITY)
Admission: RE | Admit: 2017-02-18 | Discharge: 2017-02-18 | Disposition: A | Discharge status: Home / Self Care | Payer: Medicare Other | Source: Ambulatory Visit | Attending: Vascular Surgery | Admitting: Vascular Surgery

## 2017-02-18 VITALS — BP 155/78 | HR 65 | Temp 97.9°F | Resp 16 | Ht 63.0 in | Wt 144.0 lb

## 2017-02-18 DIAGNOSIS — Z9889 Other specified postprocedural states: Secondary | ICD-10-CM

## 2017-02-18 DIAGNOSIS — I6521 Occlusion and stenosis of right carotid artery: Secondary | ICD-10-CM | POA: Diagnosis present

## 2017-02-18 DIAGNOSIS — I6522 Occlusion and stenosis of left carotid artery: Secondary | ICD-10-CM | POA: Insufficient documentation

## 2017-02-18 NOTE — Patient Instructions (Signed)
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
## 2017-02-19 LAB — VAS US CAROTID
LCCAPSYS: 90 cm/s
LEFT ECA DIAS: 0 cm/s
LEFT VERTEBRAL DIAS: 11 cm/s
Left CCA dist dias: -17 cm/s
Left CCA dist sys: -70 cm/s
Left CCA prox dias: 13 cm/s
Left ICA dist dias: -28 cm/s
Left ICA dist sys: -127 cm/s
Left ICA prox dias: -24 cm/s
Left ICA prox sys: -100 cm/s
RCCADSYS: -130 cm/s
RCCAPDIAS: -8 cm/s
RIGHT CCA MID DIAS: 10 cm/s
RIGHT ECA DIAS: 0 cm/s
RIGHT VERTEBRAL DIAS: -13 cm/s
Right CCA prox sys: -55 cm/s

## 2017-03-18 DIAGNOSIS — Z01818 Encounter for other preprocedural examination: Secondary | ICD-10-CM | POA: Diagnosis not present

## 2017-09-04 DIAGNOSIS — N819 Female genital prolapse, unspecified: Secondary | ICD-10-CM | POA: Insufficient documentation

## 2017-09-04 HISTORY — DX: Female genital prolapse, unspecified: N81.9

## 2017-11-27 IMAGING — CT CT HEAD W/O CM
4 series · 17 of 47 positions shown, 19 images · non-contrast
Comparison: MRI of the head March 25, 2013

CLINICAL DATA: Headache, recent carotid surgery. History of
hypertension.

EXAM:
CT HEAD WITHOUT CONTRAST
TECHNIQUE: Contiguous axial images were obtained from the base of the skull
through the vertex without intravenous contrast.

[Series 2: head without · axial · non-contrast · 0.41mm/px · z∈[-64,+56]mm · 7 of 33 slices shown, 9 images]
[im 5/33  brain]
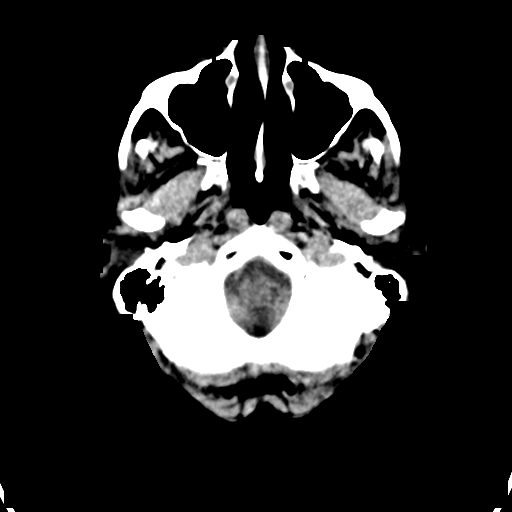
[im 5/33  bone]
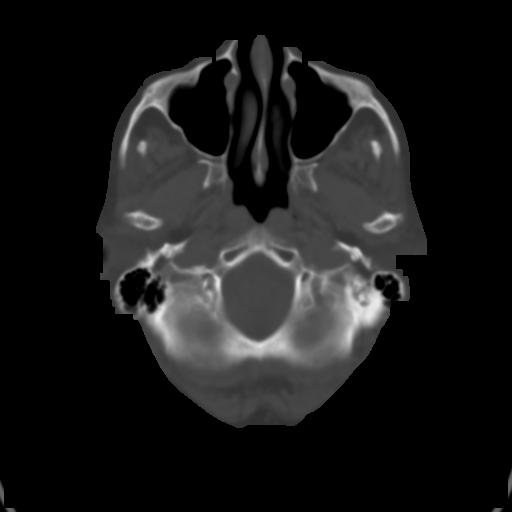
[im 9/33  brain]
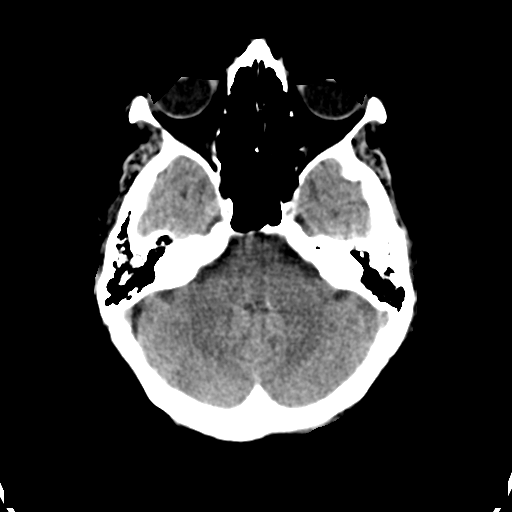
[im 13/33  brain]
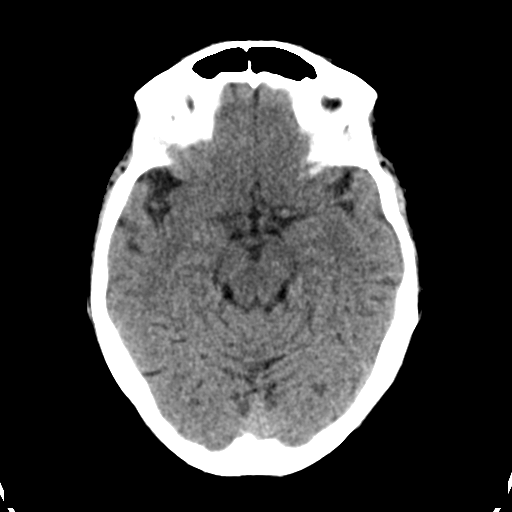
[im 17/33  brain]
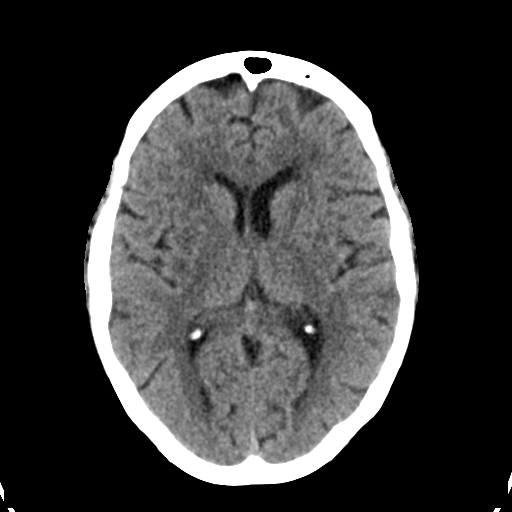
[im 21/33  brain]
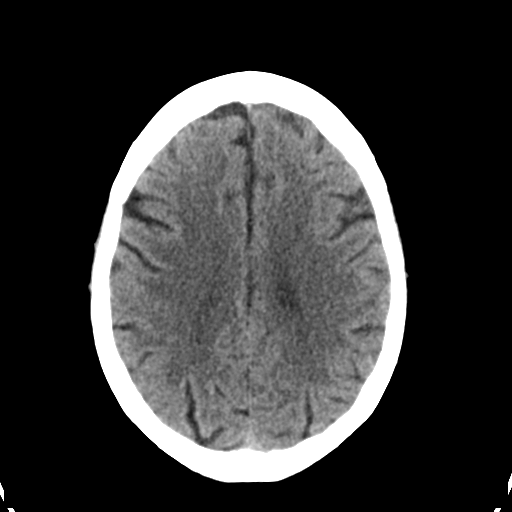
[im 21/33  bone]
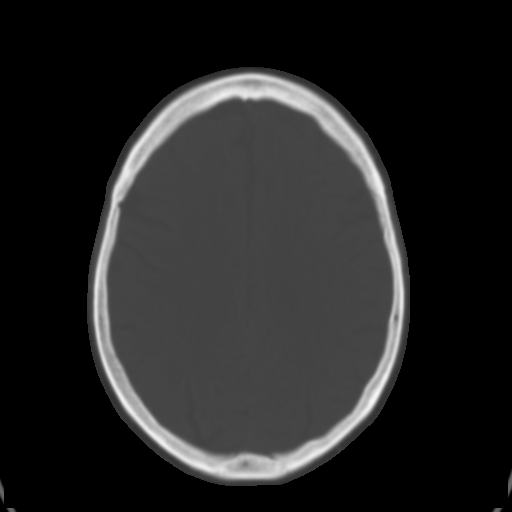
[im 25/33  brain]
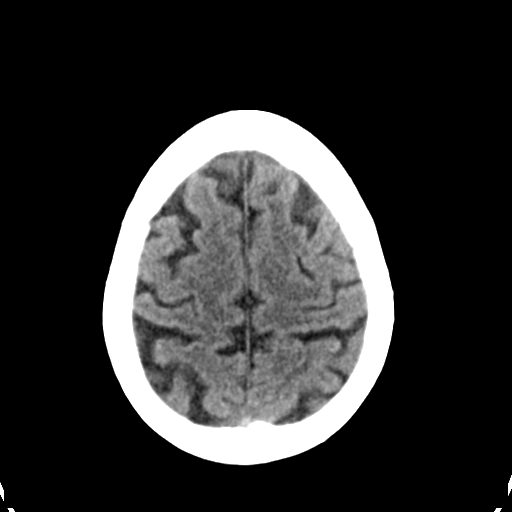
[im 29/33  brain]
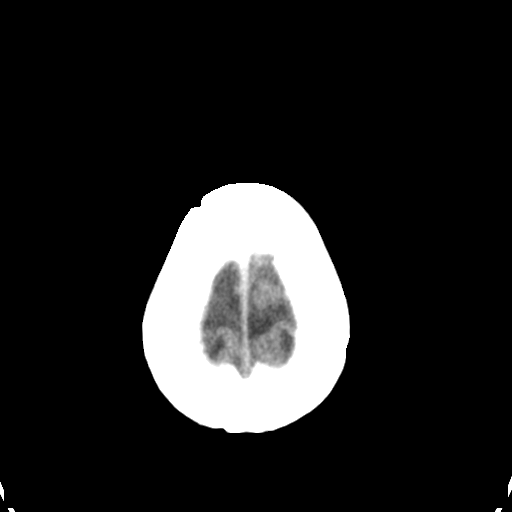

[Series 3: head bone · axial · 0.41mm/px · z∈[-68,-12]mm · 4 of 82 slices shown]
[im 9/82  bone]
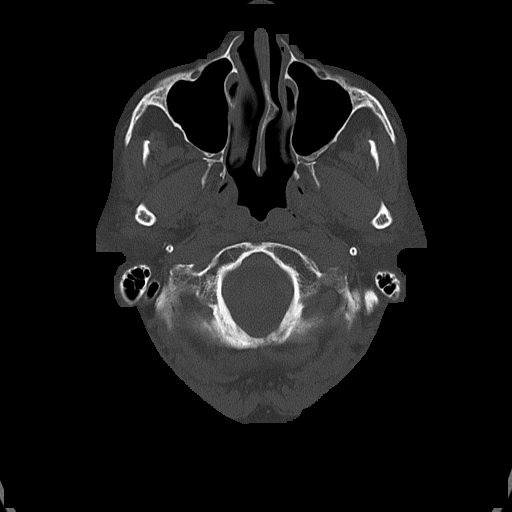
[im 17/82  bone]
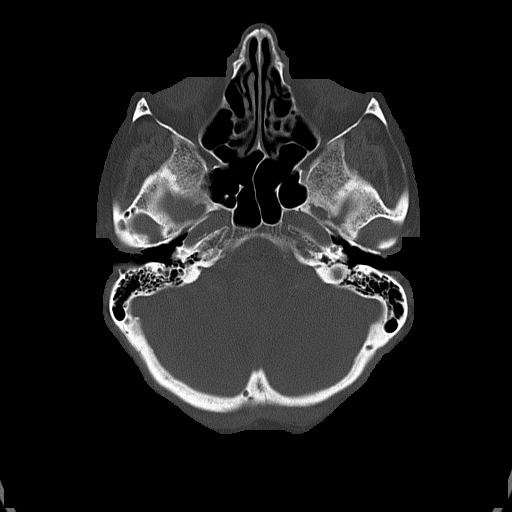
[im 25/82  bone]
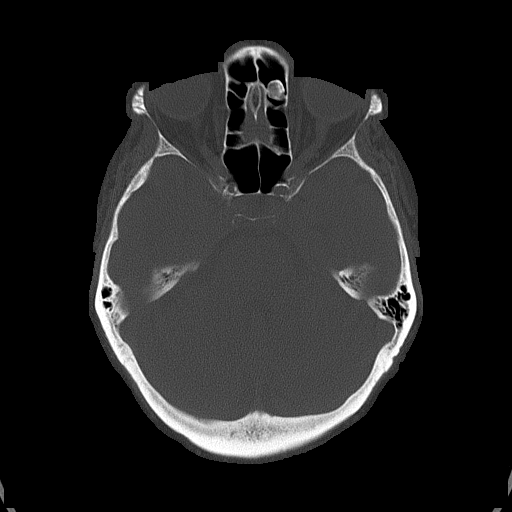
[im 37/82  bone]
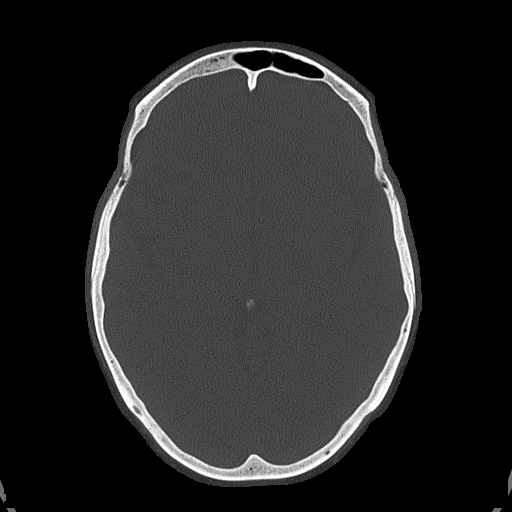

[Series 4: head without cor · coronal · non-contrast · 0.33mm/px · 3 of 65 slices shown]
[im 22/65  brain]
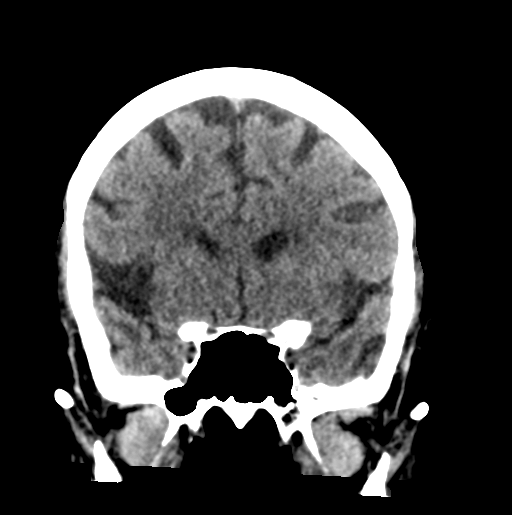
[im 29/65  brain]
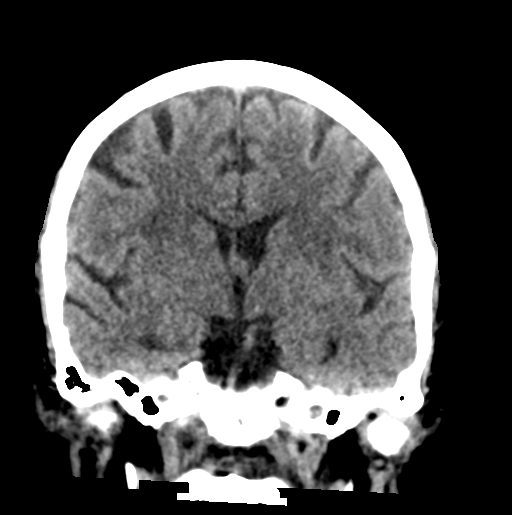
[im 36/65  brain]
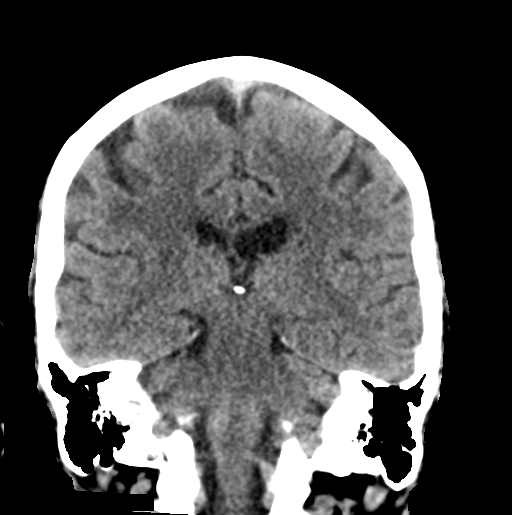

[Series 5: head without sag · sagittal · non-contrast · 0.35mm/px · 3 of 67 slices shown]
[im 23/67  brain]
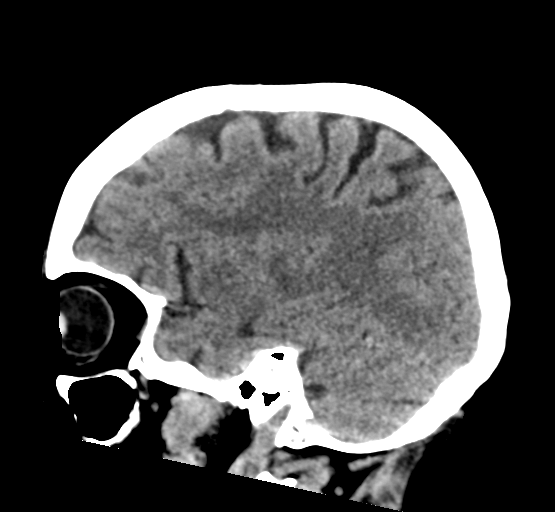
[im 34/67  brain]
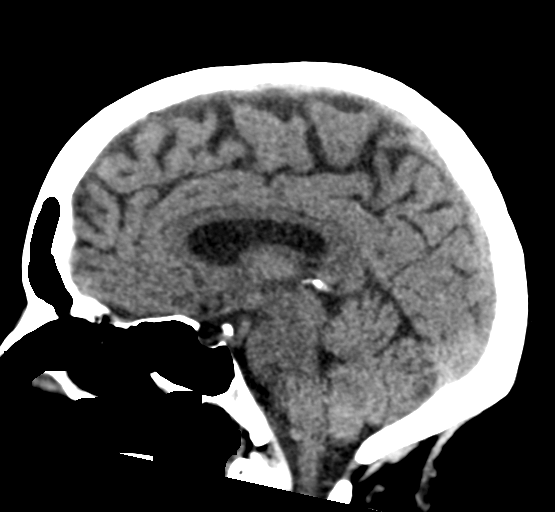
[im 45/67  brain]
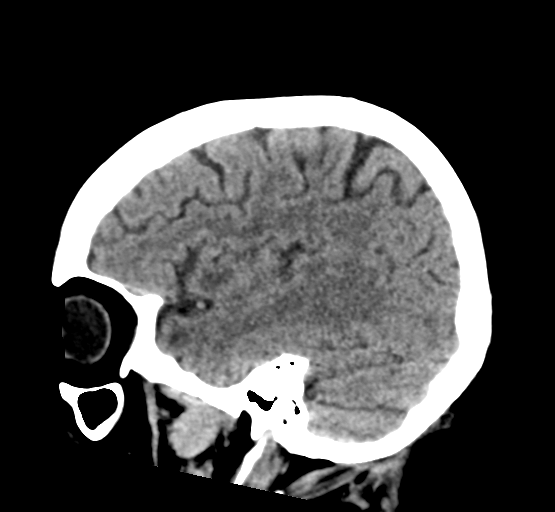

[17 of 47 positions shown; findings below may reference images not displayed]

FINDINGS: BRAIN: The ventricles and sulci are normal for age. No
intraparenchymal hemorrhage, mass effect nor midline shift. Patchy
supratentorial white matter hypodensities less than expected for
patient's age, though non-specific are most compatible with chronic
small vessel ischemic disease. Old bilateral basal ganglia lacunar
infarcts. No acute large vascular territory infarcts. No abnormal
extra-axial fluid collections. Basal cisterns are patent.

VASCULAR: Moderate calcific atherosclerosis of the carotid siphons.

SKULL: No skull fracture. No significant scalp soft tissue swelling.

SINUSES/ORBITS: The mastoid air-cells and included paranasal sinuses
are well-aerated. Small frontoethmoidal osteoma is. The included
ocular globes and orbital contents are non-suspicious. Unerupted
LEFT maxillary molar.

OTHER: None.
IMPRESSION: No acute intracranial process.

Stable mild chronic small vessel ischemic disease and old basal
ganglia lacunar infarcts.

## 2018-01-27 DIAGNOSIS — L570 Actinic keratosis: Secondary | ICD-10-CM | POA: Diagnosis not present

## 2018-01-27 DIAGNOSIS — L820 Inflamed seborrheic keratosis: Secondary | ICD-10-CM | POA: Diagnosis not present

## 2018-01-27 DIAGNOSIS — L578 Other skin changes due to chronic exposure to nonionizing radiation: Secondary | ICD-10-CM | POA: Diagnosis not present

## 2018-01-27 DIAGNOSIS — C44222 Squamous cell carcinoma of skin of right ear and external auricular canal: Secondary | ICD-10-CM | POA: Diagnosis not present

## 2018-02-07 DIAGNOSIS — H40033 Anatomical narrow angle, bilateral: Secondary | ICD-10-CM | POA: Diagnosis not present

## 2018-02-07 DIAGNOSIS — H43393 Other vitreous opacities, bilateral: Secondary | ICD-10-CM | POA: Diagnosis not present

## 2018-02-07 DIAGNOSIS — J189 Pneumonia, unspecified organism: Secondary | ICD-10-CM | POA: Diagnosis not present

## 2018-02-07 DIAGNOSIS — R509 Fever, unspecified: Secondary | ICD-10-CM | POA: Diagnosis not present

## 2018-03-18 DIAGNOSIS — E538 Deficiency of other specified B group vitamins: Secondary | ICD-10-CM | POA: Diagnosis not present

## 2018-03-18 DIAGNOSIS — R1030 Lower abdominal pain, unspecified: Secondary | ICD-10-CM | POA: Diagnosis not present

## 2018-03-18 DIAGNOSIS — Z23 Encounter for immunization: Secondary | ICD-10-CM | POA: Diagnosis not present

## 2018-03-18 DIAGNOSIS — E785 Hyperlipidemia, unspecified: Secondary | ICD-10-CM | POA: Diagnosis not present

## 2018-03-18 DIAGNOSIS — N183 Chronic kidney disease, stage 3 (moderate): Secondary | ICD-10-CM | POA: Diagnosis not present

## 2018-03-18 DIAGNOSIS — G4700 Insomnia, unspecified: Secondary | ICD-10-CM | POA: Diagnosis not present

## 2018-03-18 DIAGNOSIS — Z0000 Encounter for general adult medical examination without abnormal findings: Secondary | ICD-10-CM | POA: Diagnosis not present

## 2018-03-18 DIAGNOSIS — R0609 Other forms of dyspnea: Secondary | ICD-10-CM | POA: Diagnosis not present

## 2018-03-19 DIAGNOSIS — N3000 Acute cystitis without hematuria: Secondary | ICD-10-CM | POA: Diagnosis not present

## 2018-03-27 ENCOUNTER — Telehealth: Payer: Self-pay | Admitting: Emergency Medicine

## 2018-03-27 NOTE — Telephone Encounter (Signed)
Attempted to contact patient. Was unsuccessful in regards to appointment on Monday.

## 2018-03-30 ENCOUNTER — Ambulatory Visit: Payer: Medicare Other

## 2018-03-30 ENCOUNTER — Encounter: Payer: Self-pay | Admitting: Cardiology

## 2018-03-30 ENCOUNTER — Other Ambulatory Visit: Payer: Self-pay

## 2018-03-30 ENCOUNTER — Ambulatory Visit (INDEPENDENT_AMBULATORY_CARE_PROVIDER_SITE_OTHER): Payer: Medicare Other | Admitting: Cardiology

## 2018-03-30 DIAGNOSIS — I10 Essential (primary) hypertension: Secondary | ICD-10-CM | POA: Insufficient documentation

## 2018-03-30 DIAGNOSIS — R0609 Other forms of dyspnea: Secondary | ICD-10-CM | POA: Diagnosis not present

## 2018-03-30 DIAGNOSIS — E785 Hyperlipidemia, unspecified: Secondary | ICD-10-CM

## 2018-03-30 HISTORY — DX: Other forms of dyspnea: R06.09

## 2018-03-30 HISTORY — DX: Essential (primary) hypertension: I10

## 2018-03-30 HISTORY — DX: Hyperlipidemia, unspecified: E78.5

## 2018-03-30 LAB — ECHOCARDIOGRAM COMPLETE
HEIGHTINCHES: 62.5 in
Weight: 2371.2 oz

## 2018-03-30 NOTE — Telephone Encounter (Signed)
   Primary Cardiologist: No primary care provider on file.   Pt contacted.  History and symptoms reviewed.  Pt will f/u with [facility] provider as scheduled.  Pt. advised that we are restricting visitors at this time and request that only patients present for check-in prior to their appointment.  All other visitors should remain in their car.  If necessary, only one visitor may come with the patient, into the building. For everyone's safety, all patients and visitor entering our practice area should expect to be screened again prior to entering our waiting area.  Ashok Norris, RN  03/30/2018 9:11 AM

## 2018-03-30 NOTE — Patient Instructions (Signed)
Medication Instructions:  Your physician recommends that you continue on your current medications as directed. Please refer to the Current Medication list given to you today.  If you need a refill on your cardiac medications before your next appointment, please call your pharmacy.   Lab work: Your physician recommends that you return for lab work today: bmp, bnp   If you have labs (blood work) drawn today and your tests are completely normal, you will receive your results only by: Marland Kitchen MyChart Message (if you have MyChart) OR . A paper copy in the mail If you have any lab test that is abnormal or we need to change your treatment, we will call you to review the results.  Testing/Procedures: Your physician has requested that you have an echocardiogram. Echocardiography is a painless test that uses sound waves to create images of your heart. It provides your doctor with information about the size and shape of your heart and how well your heart's chambers and valves are working. This procedure takes approximately one hour. There are no restrictions for this procedure.    Follow-Up: At [facility], you and your health needs are our priority.  As part of our continuing mission to provide you with exceptional heart care, we have created designated Provider Care Teams.  These Care Teams include your primary Cardiologist (physician) and Advanced Practice Providers (APPs -  Physician Assistants and Nurse Practitioners) who all work together to provide you with the care you need, when you need it. You will need a follow up appointment in 3 months.  Please call our office 2 months in advance to schedule this appointment.  You may see No primary care provider on file. or another member of our [employer] Provider Team in [place]: Shirlee More, MD . Jyl Heinz, MD  Any Other Special Instructions Will Be Listed Below (If Applicable).   Echocardiogram An echocardiogram is a procedure that uses  painless sound waves (ultrasound) to produce an image of the heart. Images from an echocardiogram can provide important information about:  Signs of coronary artery disease (CAD).  Aneurysm detection. An aneurysm is a weak or damaged part of an artery wall that bulges out from the normal force of blood pumping through the body.  Heart size and shape. Changes in the size or shape of the heart can be associated with certain conditions, including heart failure, aneurysm, and CAD.  Heart muscle function.  Heart valve function.  Signs of a past heart attack.  Fluid buildup around the heart.  Thickening of the heart muscle.  A tumor or infectious growth around the heart valves. Tell a health care provider about:  Any allergies you have.  All medicines you are taking, including vitamins, herbs, eye drops, creams, and over-the-counter medicines.  Any blood disorders you have.  Any surgeries you have had.  Any medical conditions you have.  Whether you are pregnant or may be pregnant. What are the risks? Generally, this is a safe procedure. However, problems may occur, including:  Allergic reaction to dye (contrast) that may be used during the procedure. What happens before the procedure? No specific preparation is needed. You may eat and drink normally. What happens during the procedure?   An IV tube may be inserted into one of your veins.  You may receive contrast through this tube. A contrast is an injection that improves the quality of the pictures from your heart.  A gel will be applied to your chest.  A wand-like tool (  transducer) will be moved over your chest. The gel will help to transmit the sound waves from the transducer.  The sound waves will harmlessly bounce off of your heart to allow the heart images to be captured in real-time motion. The images will be recorded on a computer. The procedure may vary among health care providers and hospitals. What happens after  the procedure?  You may return to your normal, everyday life, including diet, activities, and medicines, unless your health care provider tells you not to do that. Summary  An echocardiogram is a procedure that uses painless sound waves (ultrasound) to produce an image of the heart.  Images from an echocardiogram can provide important information about the size and shape of your heart, heart muscle function, heart valve function, and fluid buildup around your heart.  You do not need to do anything to prepare before this procedure. You may eat and drink normally.  After the echocardiogram is completed, you may return to your normal, everyday life, unless your health care provider tells you not to do that. This information is not intended to replace advice given to you by your health care provider. Make sure you discuss any questions you have with your health care provider. Document Released: 12/22/1999 Document Revised: 01/27/2016 Document Reviewed: 01/27/2016 Elsevier Interactive Patient Education  2019 [employer].
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
## 2018-03-30 NOTE — Progress Notes (Signed)
Complete echocardiogram has been performed.  Jimmy Jaylean Buenaventura, RDCS, RVT 

## 2018-03-31 LAB — BASIC METABOLIC PANEL
BUN/Creatinine Ratio: 18 (ref 12–28)
BUN: 31 mg/dL — ABNORMAL HIGH (ref 8–27)
CALCIUM: 9.8 mg/dL (ref 8.7–10.3)
CO2: 25 mmol/L (ref 20–29)
CREATININE: 1.7 mg/dL — AB (ref 0.57–1.00)
Chloride: 101 mmol/L (ref 96–106)
GFR calc Af Amer: 32 mL/min/{1.73_m2} — ABNORMAL LOW (ref >59)
GFR, EST NON AFRICAN AMERICAN: 28 mL/min/{1.73_m2} — AB (ref >59)
GLUCOSE: 92 mg/dL (ref 65–99)
POTASSIUM: 4.2 mmol/L (ref 3.5–5.2)
SODIUM: 141 mmol/L (ref 134–144)

## 2018-03-31 LAB — PRO B NATRIURETIC PEPTIDE: NT-PRO BNP: 481 pg/mL (ref 0–738)

## 2018-04-10 DIAGNOSIS — Z4689 Encounter for fitting and adjustment of other specified devices: Secondary | ICD-10-CM | POA: Diagnosis not present

## 2018-04-13 ENCOUNTER — Other Ambulatory Visit: Payer: Self-pay

## 2018-04-13 DIAGNOSIS — I6521 Occlusion and stenosis of right carotid artery: Secondary | ICD-10-CM

## 2018-04-21 ENCOUNTER — Ambulatory Visit: Payer: Self-pay | Admitting: Family

## 2018-04-21 ENCOUNTER — Encounter (HOSPITAL_COMMUNITY): Payer: Self-pay

## 2018-06-08 DIAGNOSIS — L578 Other skin changes due to chronic exposure to nonionizing radiation: Secondary | ICD-10-CM | POA: Diagnosis not present

## 2018-06-08 DIAGNOSIS — L570 Actinic keratosis: Secondary | ICD-10-CM | POA: Diagnosis not present

## 2018-06-08 DIAGNOSIS — C44222 Squamous cell carcinoma of skin of right ear and external auricular canal: Secondary | ICD-10-CM | POA: Diagnosis not present

## 2018-06-08 DIAGNOSIS — L821 Other seborrheic keratosis: Secondary | ICD-10-CM | POA: Diagnosis not present

## 2018-06-10 DIAGNOSIS — Z4689 Encounter for fitting and adjustment of other specified devices: Secondary | ICD-10-CM | POA: Diagnosis not present

## 2018-07-06 ENCOUNTER — Other Ambulatory Visit: Payer: Self-pay

## 2018-07-06 ENCOUNTER — Telehealth (INDEPENDENT_AMBULATORY_CARE_PROVIDER_SITE_OTHER): Payer: Medicare Other | Admitting: Cardiology

## 2018-07-06 ENCOUNTER — Encounter: Payer: Self-pay | Admitting: Cardiology

## 2018-07-06 DIAGNOSIS — E785 Hyperlipidemia, unspecified: Secondary | ICD-10-CM

## 2018-07-06 DIAGNOSIS — R0609 Other forms of dyspnea: Secondary | ICD-10-CM | POA: Diagnosis not present

## 2018-07-06 DIAGNOSIS — I6521 Occlusion and stenosis of right carotid artery: Secondary | ICD-10-CM

## 2018-07-06 DIAGNOSIS — I10 Essential (primary) hypertension: Secondary | ICD-10-CM
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
## 2018-07-06 NOTE — Patient Instructions (Signed)
Medication Instructions:  Your physician recommends that you continue on your current medications as directed. Please refer to the Current Medication list given to you today.  If you need a refill on your cardiac medications before your next appointment, please call your pharmacy.   Lab work: None.  If you have labs (blood work) drawn today and your tests are completely normal, you will receive your results only by: . MyChart Message (if you have MyChart) OR . A paper copy in the mail If you have any lab test that is abnormal or we need to change your treatment, we will call you to review the results.  Testing/Procedures: None.   Follow-Up: At [facility], you and your health needs are our priority.  As part of our continuing mission to provide you with exceptional heart care, we have created designated Provider Care Teams.  These Care Teams include your primary Cardiologist (physician) and Advanced Practice Providers (APPs -  Physician Assistants and Nurse Practitioners) who all work together to provide you with the care you need, when you need it. You will need a follow up appointment in 5 months.  Please call our office 2 months in advance to schedule this appointment.  You may see No primary care provider on file. or another member of our [facility] Provider Team in [place]: Brian Munley, MD . Rajan Revankar, MD  Any Other Special Instructions Will Be Listed Below (If Applicable).    

## 2018-07-14 ENCOUNTER — Telehealth: Payer: Self-pay | Admitting: *Deleted

## 2018-07-17 ENCOUNTER — Other Ambulatory Visit: Payer: Self-pay

## 2018-07-17 ENCOUNTER — Ambulatory Visit (HOSPITAL_COMMUNITY)
Admission: RE | Admit: 2018-07-17 | Discharge: 2018-07-17 | Disposition: A | Discharge status: Home / Self Care | Payer: Medicare Other | Source: Ambulatory Visit | Attending: Family | Admitting: Family

## 2018-07-17 ENCOUNTER — Encounter: Payer: Self-pay | Admitting: Family

## 2018-07-17 ENCOUNTER — Ambulatory Visit (INDEPENDENT_AMBULATORY_CARE_PROVIDER_SITE_OTHER): Payer: Medicare Other | Admitting: Family

## 2018-07-17 VITALS — BP 168/80 | HR 63 | Temp 97.8°F | Resp 14 | Ht 63.0 in | Wt 149.0 lb

## 2018-07-17 DIAGNOSIS — I6521 Occlusion and stenosis of right carotid artery: Secondary | ICD-10-CM | POA: Insufficient documentation

## 2018-07-17 DIAGNOSIS — Z9889 Other specified postprocedural states: Secondary | ICD-10-CM
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
## 2018-07-17 NOTE — Patient Instructions (Signed)

## 2018-08-24 ENCOUNTER — Ambulatory Visit: Payer: Medicare Other | Admitting: Podiatry

## 2018-09-03 DIAGNOSIS — L578 Other skin changes due to chronic exposure to nonionizing radiation: Secondary | ICD-10-CM | POA: Diagnosis not present

## 2018-09-03 DIAGNOSIS — L570 Actinic keratosis: Secondary | ICD-10-CM | POA: Diagnosis not present

## 2018-09-03 DIAGNOSIS — C44222 Squamous cell carcinoma of skin of right ear and external auricular canal: Secondary | ICD-10-CM | POA: Diagnosis not present

## 2018-09-17 DIAGNOSIS — R6889 Other general symptoms and signs: Secondary | ICD-10-CM | POA: Diagnosis not present

## 2018-10-02 DIAGNOSIS — Z4689 Encounter for fitting and adjustment of other specified devices: Secondary | ICD-10-CM | POA: Diagnosis not present

## 2018-11-12 DIAGNOSIS — Z1231 Encounter for screening mammogram for malignant neoplasm of breast: Secondary | ICD-10-CM | POA: Diagnosis not present

## 2018-11-17 DIAGNOSIS — I10 Essential (primary) hypertension: Secondary | ICD-10-CM | POA: Diagnosis not present

## 2018-11-17 DIAGNOSIS — Z79899 Other long term (current) drug therapy: Secondary | ICD-10-CM | POA: Diagnosis not present

## 2018-11-17 DIAGNOSIS — E538 Deficiency of other specified B group vitamins: Secondary | ICD-10-CM | POA: Diagnosis not present

## 2018-11-17 DIAGNOSIS — Z23 Encounter for immunization: Secondary | ICD-10-CM | POA: Diagnosis not present

## 2018-11-17 DIAGNOSIS — E785 Hyperlipidemia, unspecified: Secondary | ICD-10-CM | POA: Diagnosis not present

## 2018-11-17 DIAGNOSIS — E559 Vitamin D deficiency, unspecified: Secondary | ICD-10-CM | POA: Diagnosis not present

## 2018-11-20 DIAGNOSIS — R42 Dizziness and giddiness: Secondary | ICD-10-CM | POA: Diagnosis not present

## 2018-11-24 DIAGNOSIS — E538 Deficiency of other specified B group vitamins: Secondary | ICD-10-CM | POA: Diagnosis not present

## 2018-11-27 DIAGNOSIS — R21 Rash and other nonspecific skin eruption: Secondary | ICD-10-CM | POA: Diagnosis not present

## 2018-12-01 DIAGNOSIS — E538 Deficiency of other specified B group vitamins: Secondary | ICD-10-CM | POA: Diagnosis not present

## 2018-12-02 ENCOUNTER — Ambulatory Visit: Payer: Medicare Other | Admitting: Cardiology

## 2018-12-08 DIAGNOSIS — E538 Deficiency of other specified B group vitamins: Secondary | ICD-10-CM | POA: Diagnosis not present

## 2018-12-17 DIAGNOSIS — R05 Cough: Secondary | ICD-10-CM | POA: Diagnosis not present

## 2018-12-17 DIAGNOSIS — I10 Essential (primary) hypertension: Secondary | ICD-10-CM | POA: Diagnosis not present

## 2018-12-17 DIAGNOSIS — R6889 Other general symptoms and signs: Secondary | ICD-10-CM | POA: Diagnosis not present

## 2018-12-17 DIAGNOSIS — E538 Deficiency of other specified B group vitamins: Secondary | ICD-10-CM | POA: Diagnosis not present

## 2018-12-24 DIAGNOSIS — D696 Thrombocytopenia, unspecified: Secondary | ICD-10-CM | POA: Diagnosis not present

## 2018-12-24 DIAGNOSIS — R944 Abnormal results of kidney function studies: Secondary | ICD-10-CM | POA: Diagnosis not present

## 2018-12-24 DIAGNOSIS — L570 Actinic keratosis: Secondary | ICD-10-CM | POA: Diagnosis not present

## 2018-12-24 DIAGNOSIS — L853 Xerosis cutis: Secondary | ICD-10-CM | POA: Diagnosis not present

## 2018-12-24 DIAGNOSIS — L820 Inflamed seborrheic keratosis: Secondary | ICD-10-CM | POA: Diagnosis not present

## 2019-01-08 HISTORY — PX: SKIN LESION EXCISION: SHX2412

## 2019-01-19 DIAGNOSIS — J029 Acute pharyngitis, unspecified: Secondary | ICD-10-CM | POA: Diagnosis not present

## 2019-01-19 DIAGNOSIS — R519 Headache, unspecified: Secondary | ICD-10-CM | POA: Diagnosis not present

## 2019-01-19 DIAGNOSIS — R070 Pain in throat: Secondary | ICD-10-CM | POA: Diagnosis not present

## 2019-01-19 DIAGNOSIS — Z20828 Contact with and (suspected) exposure to other viral communicable diseases: Secondary | ICD-10-CM | POA: Diagnosis not present

## 2019-02-04 DIAGNOSIS — I10 Essential (primary) hypertension: Secondary | ICD-10-CM | POA: Diagnosis not present

## 2019-02-04 DIAGNOSIS — N390 Urinary tract infection, site not specified: Secondary | ICD-10-CM | POA: Diagnosis not present

## 2019-02-04 DIAGNOSIS — Z0000 Encounter for general adult medical examination without abnormal findings: Secondary | ICD-10-CM | POA: Diagnosis not present

## 2019-02-04 DIAGNOSIS — E785 Hyperlipidemia, unspecified: Secondary | ICD-10-CM | POA: Diagnosis not present

## 2019-02-04 DIAGNOSIS — Z1331 Encounter for screening for depression: Secondary | ICD-10-CM | POA: Diagnosis not present

## 2019-02-04 DIAGNOSIS — E559 Vitamin D deficiency, unspecified: Secondary | ICD-10-CM | POA: Diagnosis not present

## 2019-02-04 DIAGNOSIS — E538 Deficiency of other specified B group vitamins: Secondary | ICD-10-CM | POA: Diagnosis not present

## 2019-02-04 DIAGNOSIS — D696 Thrombocytopenia, unspecified: Secondary | ICD-10-CM | POA: Diagnosis not present

## 2019-02-04 DIAGNOSIS — Z6827 Body mass index (BMI) 27.0-27.9, adult: Secondary | ICD-10-CM | POA: Diagnosis not present

## 2019-02-04 DIAGNOSIS — Z9181 History of falling: Secondary | ICD-10-CM | POA: Diagnosis not present

## 2019-02-08 DIAGNOSIS — M85852 Other specified disorders of bone density and structure, left thigh: Secondary | ICD-10-CM | POA: Diagnosis not present

## 2019-02-08 DIAGNOSIS — M8589 Other specified disorders of bone density and structure, multiple sites: Secondary | ICD-10-CM | POA: Diagnosis not present

## 2019-02-08 DIAGNOSIS — N959 Unspecified menopausal and perimenopausal disorder: Secondary | ICD-10-CM | POA: Diagnosis not present

## 2019-02-09 ENCOUNTER — Other Ambulatory Visit: Payer: Self-pay

## 2019-02-09 ENCOUNTER — Ambulatory Visit: Payer: Self-pay

## 2019-02-09 ENCOUNTER — Encounter: Payer: Self-pay | Admitting: Orthopaedic Surgery

## 2019-02-09 ENCOUNTER — Ambulatory Visit (INDEPENDENT_AMBULATORY_CARE_PROVIDER_SITE_OTHER): Payer: Medicare HMO | Admitting: Orthopaedic Surgery

## 2019-02-09 VITALS — BP 202/86 | HR 57 | Ht 61.0 in | Wt 148.0 lb

## 2019-02-09 DIAGNOSIS — M810 Age-related osteoporosis without current pathological fracture: Secondary | ICD-10-CM

## 2019-02-09 DIAGNOSIS — M545 Low back pain, unspecified: Secondary | ICD-10-CM

## 2019-02-09 DIAGNOSIS — S32000A Wedge compression fracture of unspecified lumbar vertebra, initial encounter for closed fracture: Secondary | ICD-10-CM | POA: Insufficient documentation

## 2019-02-09 DIAGNOSIS — S32040A Wedge compression fracture of fourth lumbar vertebra, initial encounter for closed fracture: Secondary | ICD-10-CM

## 2019-02-09 HISTORY — DX: Wedge compression fracture of unspecified lumbar vertebra, initial encounter for closed fracture: S32.000A

## 2019-02-09 HISTORY — DX: Age-related osteoporosis without current pathological fracture: M81.0
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
## 2019-02-09 NOTE — Addendum Note (Signed)
Addended by: Meyer Cory on: 02/09/2019 10:44 AM   Modules accepted: Orders

## 2019-02-11 ENCOUNTER — Telehealth: Payer: Self-pay | Admitting: *Deleted

## 2019-02-11 NOTE — Telephone Encounter (Signed)
Pt is scheduled at [facility] for MRI on Tues Feb 9 at 11:45 arrival for a 12:00 appt. I called pt and left vm to return my call .

## 2019-02-15 NOTE — Telephone Encounter (Signed)
Pt aware of appt.

## 2019-02-24 DIAGNOSIS — M810 Age-related osteoporosis without current pathological fracture: Secondary | ICD-10-CM | POA: Diagnosis not present

## 2019-02-24 DIAGNOSIS — X58XXXA Exposure to other specified factors, initial encounter: Secondary | ICD-10-CM | POA: Diagnosis not present

## 2019-02-24 DIAGNOSIS — M48061 Spinal stenosis, lumbar region without neurogenic claudication: Secondary | ICD-10-CM | POA: Diagnosis not present

## 2019-02-24 DIAGNOSIS — S32040A Wedge compression fracture of fourth lumbar vertebra, initial encounter for closed fracture: Secondary | ICD-10-CM | POA: Diagnosis not present

## 2019-02-24 DIAGNOSIS — M545 Low back pain: Secondary | ICD-10-CM | POA: Diagnosis not present

## 2019-03-02 ENCOUNTER — Ambulatory Visit (INDEPENDENT_AMBULATORY_CARE_PROVIDER_SITE_OTHER): Payer: Medicare HMO | Admitting: Orthopaedic Surgery

## 2019-03-02 ENCOUNTER — Encounter: Payer: Self-pay | Admitting: Orthopaedic Surgery

## 2019-03-02 ENCOUNTER — Other Ambulatory Visit: Payer: Self-pay

## 2019-03-02 VITALS — Ht 62.0 in | Wt 146.0 lb

## 2019-03-02 DIAGNOSIS — M545 Low back pain, unspecified: Secondary | ICD-10-CM
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
## 2019-03-04 DIAGNOSIS — M8000XA Age-related osteoporosis with current pathological fracture, unspecified site, initial encounter for fracture: Secondary | ICD-10-CM | POA: Diagnosis not present

## 2019-03-04 DIAGNOSIS — E785 Hyperlipidemia, unspecified: Secondary | ICD-10-CM | POA: Diagnosis not present

## 2019-03-04 DIAGNOSIS — I10 Essential (primary) hypertension: Secondary | ICD-10-CM | POA: Diagnosis not present

## 2019-03-15 DIAGNOSIS — M545 Low back pain: Secondary | ICD-10-CM | POA: Diagnosis not present

## 2019-03-18 DIAGNOSIS — M545 Low back pain: Secondary | ICD-10-CM | POA: Diagnosis not present

## 2019-03-18 DIAGNOSIS — S32010D Wedge compression fracture of first lumbar vertebra, subsequent encounter for fracture with routine healing: Secondary | ICD-10-CM | POA: Diagnosis not present

## 2019-03-19 DIAGNOSIS — I10 Essential (primary) hypertension: Secondary | ICD-10-CM | POA: Diagnosis not present

## 2019-03-22 DIAGNOSIS — M545 Low back pain: Secondary | ICD-10-CM | POA: Diagnosis not present

## 2019-03-22 DIAGNOSIS — S32010D Wedge compression fracture of first lumbar vertebra, subsequent encounter for fracture with routine healing: Secondary | ICD-10-CM | POA: Diagnosis not present

## 2019-03-25 DIAGNOSIS — S32010D Wedge compression fracture of first lumbar vertebra, subsequent encounter for fracture with routine healing: Secondary | ICD-10-CM | POA: Diagnosis not present

## 2019-03-25 DIAGNOSIS — M545 Low back pain: Secondary | ICD-10-CM | POA: Diagnosis not present

## 2019-03-29 DIAGNOSIS — S32010D Wedge compression fracture of first lumbar vertebra, subsequent encounter for fracture with routine healing: Secondary | ICD-10-CM | POA: Diagnosis not present

## 2019-03-29 DIAGNOSIS — M545 Low back pain: Secondary | ICD-10-CM | POA: Diagnosis not present

## 2019-03-30 ENCOUNTER — Ambulatory Visit (INDEPENDENT_AMBULATORY_CARE_PROVIDER_SITE_OTHER): Payer: Medicare HMO | Admitting: Orthopaedic Surgery

## 2019-03-30 ENCOUNTER — Other Ambulatory Visit: Payer: Self-pay

## 2019-03-30 ENCOUNTER — Encounter: Payer: Self-pay | Admitting: Orthopaedic Surgery

## 2019-03-30 VITALS — BP 189/80 | HR 57 | Ht 62.0 in | Wt 148.0 lb

## 2019-03-30 DIAGNOSIS — S32040A Wedge compression fracture of fourth lumbar vertebra, initial encounter for closed fracture: Secondary | ICD-10-CM | POA: Diagnosis not present

## 2019-03-30 DIAGNOSIS — M810 Age-related osteoporosis without current pathological fracture: Secondary | ICD-10-CM
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
## 2019-04-02 ENCOUNTER — Telehealth: Payer: Self-pay | Admitting: Orthopaedic Surgery

## 2019-04-02 NOTE — Telephone Encounter (Signed)
Records faxed to [facility] [phone]

## 2019-04-05 DIAGNOSIS — S32010D Wedge compression fracture of first lumbar vertebra, subsequent encounter for fracture with routine healing: Secondary | ICD-10-CM | POA: Diagnosis not present

## 2019-04-05 DIAGNOSIS — M545 Low back pain: Secondary | ICD-10-CM | POA: Diagnosis not present

## 2019-04-07 DIAGNOSIS — W1809XA Striking against other object with subsequent fall, initial encounter: Secondary | ICD-10-CM | POA: Diagnosis not present

## 2019-04-07 DIAGNOSIS — S59902A Unspecified injury of left elbow, initial encounter: Secondary | ICD-10-CM | POA: Diagnosis not present

## 2019-04-07 DIAGNOSIS — S2091XA Abrasion of unspecified parts of thorax, initial encounter: Secondary | ICD-10-CM | POA: Diagnosis not present

## 2019-04-07 DIAGNOSIS — S82031A Displaced transverse fracture of right patella, initial encounter for closed fracture: Secondary | ICD-10-CM | POA: Diagnosis not present

## 2019-04-07 DIAGNOSIS — R609 Edema, unspecified: Secondary | ICD-10-CM | POA: Diagnosis not present

## 2019-04-07 DIAGNOSIS — S82001A Unspecified fracture of right patella, initial encounter for closed fracture: Secondary | ICD-10-CM | POA: Diagnosis not present

## 2019-04-07 DIAGNOSIS — Z743 Need for continuous supervision: Secondary | ICD-10-CM | POA: Diagnosis not present

## 2019-04-07 DIAGNOSIS — S8992XA Unspecified injury of left lower leg, initial encounter: Secondary | ICD-10-CM | POA: Diagnosis not present

## 2019-04-07 DIAGNOSIS — M25561 Pain in right knee: Secondary | ICD-10-CM | POA: Diagnosis not present

## 2019-04-07 DIAGNOSIS — R52 Pain, unspecified: Secondary | ICD-10-CM | POA: Diagnosis not present

## 2019-04-07 DIAGNOSIS — R42 Dizziness and giddiness: Secondary | ICD-10-CM | POA: Diagnosis not present

## 2019-04-07 DIAGNOSIS — M25562 Pain in left knee: Secondary | ICD-10-CM | POA: Diagnosis not present

## 2019-04-08 DIAGNOSIS — S82001A Unspecified fracture of right patella, initial encounter for closed fracture: Secondary | ICD-10-CM | POA: Diagnosis not present

## 2019-04-08 DIAGNOSIS — Z1159 Encounter for screening for other viral diseases: Secondary | ICD-10-CM | POA: Diagnosis not present

## 2019-04-08 DIAGNOSIS — Z1152 Encounter for screening for COVID-19: Secondary | ICD-10-CM | POA: Diagnosis not present

## 2019-04-12 DIAGNOSIS — S82001A Unspecified fracture of right patella, initial encounter for closed fracture: Secondary | ICD-10-CM | POA: Diagnosis not present

## 2019-04-13 DIAGNOSIS — S82001A Unspecified fracture of right patella, initial encounter for closed fracture: Secondary | ICD-10-CM | POA: Diagnosis not present

## 2019-04-13 DIAGNOSIS — E7800 Pure hypercholesterolemia, unspecified: Secondary | ICD-10-CM | POA: Diagnosis not present

## 2019-04-13 DIAGNOSIS — I10 Essential (primary) hypertension: Secondary | ICD-10-CM | POA: Diagnosis not present

## 2019-04-13 DIAGNOSIS — G8918 Other acute postprocedural pain: Secondary | ICD-10-CM | POA: Diagnosis not present

## 2019-04-13 DIAGNOSIS — Z86718 Personal history of other venous thrombosis and embolism: Secondary | ICD-10-CM | POA: Diagnosis not present

## 2019-04-13 DIAGNOSIS — M8580 Other specified disorders of bone density and structure, unspecified site: Secondary | ICD-10-CM | POA: Diagnosis not present

## 2019-04-13 DIAGNOSIS — Z79899 Other long term (current) drug therapy: Secondary | ICD-10-CM | POA: Diagnosis not present

## 2019-04-13 DIAGNOSIS — S82031A Displaced transverse fracture of right patella, initial encounter for closed fracture: Secondary | ICD-10-CM | POA: Diagnosis not present

## 2019-04-13 HISTORY — PX: KNEE SURGERY: SHX244

## 2019-04-20 DIAGNOSIS — I10 Essential (primary) hypertension: Secondary | ICD-10-CM | POA: Diagnosis not present

## 2019-04-20 DIAGNOSIS — W19XXXA Unspecified fall, initial encounter: Secondary | ICD-10-CM | POA: Diagnosis not present

## 2019-04-20 DIAGNOSIS — S82001A Unspecified fracture of right patella, initial encounter for closed fracture: Secondary | ICD-10-CM | POA: Diagnosis not present

## 2019-04-20 DIAGNOSIS — Z9181 History of falling: Secondary | ICD-10-CM | POA: Diagnosis not present

## 2019-04-20 DIAGNOSIS — Z79899 Other long term (current) drug therapy: Secondary | ICD-10-CM | POA: Diagnosis not present

## 2019-04-26 DIAGNOSIS — S82001A Unspecified fracture of right patella, initial encounter for closed fracture: Secondary | ICD-10-CM | POA: Diagnosis not present

## 2019-05-05 DIAGNOSIS — E785 Hyperlipidemia, unspecified: Secondary | ICD-10-CM | POA: Diagnosis not present

## 2019-05-05 DIAGNOSIS — I10 Essential (primary) hypertension: Secondary | ICD-10-CM | POA: Diagnosis not present

## 2019-05-05 DIAGNOSIS — M5431 Sciatica, right side: Secondary | ICD-10-CM | POA: Diagnosis not present

## 2019-05-05 DIAGNOSIS — E538 Deficiency of other specified B group vitamins: Secondary | ICD-10-CM | POA: Diagnosis not present

## 2019-05-05 DIAGNOSIS — E559 Vitamin D deficiency, unspecified: Secondary | ICD-10-CM | POA: Diagnosis not present

## 2019-05-17 DIAGNOSIS — S82001A Unspecified fracture of right patella, initial encounter for closed fracture: Secondary | ICD-10-CM | POA: Diagnosis not present

## 2019-05-21 DIAGNOSIS — I10 Essential (primary) hypertension: Secondary | ICD-10-CM | POA: Diagnosis not present

## 2019-05-21 DIAGNOSIS — M810 Age-related osteoporosis without current pathological fracture: Secondary | ICD-10-CM | POA: Diagnosis not present

## 2019-05-21 DIAGNOSIS — E785 Hyperlipidemia, unspecified: Secondary | ICD-10-CM | POA: Diagnosis not present

## 2019-05-21 DIAGNOSIS — R269 Unspecified abnormalities of gait and mobility: Secondary | ICD-10-CM | POA: Diagnosis not present

## 2019-05-21 DIAGNOSIS — R69 Illness, unspecified: Secondary | ICD-10-CM | POA: Diagnosis not present

## 2019-05-21 DIAGNOSIS — G8929 Other chronic pain: Secondary | ICD-10-CM | POA: Diagnosis not present

## 2019-05-21 DIAGNOSIS — Z7983 Long term (current) use of bisphosphonates: Secondary | ICD-10-CM | POA: Diagnosis not present

## 2019-05-27 DIAGNOSIS — N289 Disorder of kidney and ureter, unspecified: Secondary | ICD-10-CM | POA: Diagnosis not present

## 2019-05-31 DIAGNOSIS — L821 Other seborrheic keratosis: Secondary | ICD-10-CM | POA: Diagnosis not present

## 2019-05-31 DIAGNOSIS — L570 Actinic keratosis: Secondary | ICD-10-CM | POA: Diagnosis not present

## 2019-06-04 DIAGNOSIS — L821 Other seborrheic keratosis: Secondary | ICD-10-CM | POA: Diagnosis not present

## 2019-06-14 DIAGNOSIS — S82001D Unspecified fracture of right patella, subsequent encounter for closed fracture with routine healing: Secondary | ICD-10-CM | POA: Diagnosis not present

## 2019-06-14 DIAGNOSIS — M25561 Pain in right knee: Secondary | ICD-10-CM | POA: Diagnosis not present

## 2019-06-15 DIAGNOSIS — N289 Disorder of kidney and ureter, unspecified: Secondary | ICD-10-CM | POA: Diagnosis not present

## 2019-06-17 DIAGNOSIS — S82001D Unspecified fracture of right patella, subsequent encounter for closed fracture with routine healing: Secondary | ICD-10-CM | POA: Diagnosis not present

## 2019-06-17 DIAGNOSIS — M25561 Pain in right knee: Secondary | ICD-10-CM | POA: Diagnosis not present

## 2019-06-21 DIAGNOSIS — M25561 Pain in right knee: Secondary | ICD-10-CM | POA: Diagnosis not present

## 2019-06-21 DIAGNOSIS — S82001D Unspecified fracture of right patella, subsequent encounter for closed fracture with routine healing: Secondary | ICD-10-CM | POA: Diagnosis not present

## 2019-06-24 DIAGNOSIS — M25561 Pain in right knee: Secondary | ICD-10-CM | POA: Diagnosis not present

## 2019-06-24 DIAGNOSIS — S82001D Unspecified fracture of right patella, subsequent encounter for closed fracture with routine healing: Secondary | ICD-10-CM | POA: Diagnosis not present

## 2019-06-29 DIAGNOSIS — M25561 Pain in right knee: Secondary | ICD-10-CM | POA: Diagnosis not present

## 2019-06-29 DIAGNOSIS — S82001D Unspecified fracture of right patella, subsequent encounter for closed fracture with routine healing: Secondary | ICD-10-CM | POA: Diagnosis not present

## 2019-06-30 DIAGNOSIS — M25561 Pain in right knee: Secondary | ICD-10-CM | POA: Diagnosis not present

## 2019-06-30 DIAGNOSIS — S82001D Unspecified fracture of right patella, subsequent encounter for closed fracture with routine healing: Secondary | ICD-10-CM | POA: Diagnosis not present

## 2019-07-01 DIAGNOSIS — M25561 Pain in right knee: Secondary | ICD-10-CM | POA: Diagnosis not present

## 2019-07-01 DIAGNOSIS — S82001D Unspecified fracture of right patella, subsequent encounter for closed fracture with routine healing: Secondary | ICD-10-CM | POA: Diagnosis not present

## 2019-07-20 DIAGNOSIS — M25561 Pain in right knee: Secondary | ICD-10-CM | POA: Diagnosis not present

## 2019-07-20 DIAGNOSIS — S82001D Unspecified fracture of right patella, subsequent encounter for closed fracture with routine healing: Secondary | ICD-10-CM | POA: Diagnosis not present

## 2019-07-28 DIAGNOSIS — S82001D Unspecified fracture of right patella, subsequent encounter for closed fracture with routine healing: Secondary | ICD-10-CM | POA: Diagnosis not present

## 2019-07-28 DIAGNOSIS — M25561 Pain in right knee: Secondary | ICD-10-CM | POA: Diagnosis not present

## 2019-08-25 DIAGNOSIS — E785 Hyperlipidemia, unspecified: Secondary | ICD-10-CM | POA: Diagnosis not present

## 2019-08-25 DIAGNOSIS — E538 Deficiency of other specified B group vitamins: Secondary | ICD-10-CM | POA: Diagnosis not present

## 2019-08-25 DIAGNOSIS — I10 Essential (primary) hypertension: Secondary | ICD-10-CM | POA: Diagnosis not present

## 2019-08-25 DIAGNOSIS — I129 Hypertensive chronic kidney disease with stage 1 through stage 4 chronic kidney disease, or unspecified chronic kidney disease: Secondary | ICD-10-CM | POA: Diagnosis not present

## 2019-08-25 DIAGNOSIS — R5383 Other fatigue: Secondary | ICD-10-CM | POA: Diagnosis not present

## 2019-08-25 DIAGNOSIS — E559 Vitamin D deficiency, unspecified: Secondary | ICD-10-CM | POA: Diagnosis not present

## 2019-08-25 DIAGNOSIS — I739 Peripheral vascular disease, unspecified: Secondary | ICD-10-CM | POA: Diagnosis not present

## 2019-08-25 DIAGNOSIS — Z6828 Body mass index (BMI) 28.0-28.9, adult: Secondary | ICD-10-CM | POA: Diagnosis not present

## 2019-09-09 DIAGNOSIS — M79605 Pain in left leg: Secondary | ICD-10-CM | POA: Diagnosis not present

## 2019-09-09 DIAGNOSIS — R600 Localized edema: Secondary | ICD-10-CM | POA: Diagnosis not present

## 2019-09-09 DIAGNOSIS — M79604 Pain in right leg: Secondary | ICD-10-CM | POA: Diagnosis not present

## 2019-09-09 DIAGNOSIS — I87393 Chronic venous hypertension (idiopathic) with other complications of bilateral lower extremity: Secondary | ICD-10-CM | POA: Diagnosis not present

## 2019-09-30 DIAGNOSIS — H1133 Conjunctival hemorrhage, bilateral: Secondary | ICD-10-CM | POA: Diagnosis not present

## 2019-09-30 DIAGNOSIS — Z961 Presence of intraocular lens: Secondary | ICD-10-CM | POA: Diagnosis not present

## 2019-10-01 DIAGNOSIS — M79604 Pain in right leg: Secondary | ICD-10-CM | POA: Diagnosis not present

## 2019-10-01 DIAGNOSIS — R600 Localized edema: Secondary | ICD-10-CM | POA: Diagnosis not present

## 2019-10-01 DIAGNOSIS — I87393 Chronic venous hypertension (idiopathic) with other complications of bilateral lower extremity: Secondary | ICD-10-CM | POA: Diagnosis not present

## 2019-10-01 DIAGNOSIS — M79605 Pain in left leg: Secondary | ICD-10-CM | POA: Diagnosis not present

## 2019-10-04 DIAGNOSIS — S63501A Unspecified sprain of right wrist, initial encounter: Secondary | ICD-10-CM | POA: Diagnosis not present

## 2019-10-13 DIAGNOSIS — Z043 Encounter for examination and observation following other accident: Secondary | ICD-10-CM | POA: Diagnosis not present

## 2019-10-13 DIAGNOSIS — M25531 Pain in right wrist: Secondary | ICD-10-CM | POA: Diagnosis not present

## 2019-10-13 DIAGNOSIS — I8311 Varicose veins of right lower extremity with inflammation: Secondary | ICD-10-CM | POA: Diagnosis not present

## 2019-10-13 DIAGNOSIS — M25511 Pain in right shoulder: Secondary | ICD-10-CM | POA: Diagnosis not present

## 2019-10-13 DIAGNOSIS — I83893 Varicose veins of bilateral lower extremities with other complications: Secondary | ICD-10-CM | POA: Diagnosis not present

## 2019-10-13 DIAGNOSIS — I8312 Varicose veins of left lower extremity with inflammation: Secondary | ICD-10-CM | POA: Diagnosis not present

## 2019-10-13 DIAGNOSIS — M79601 Pain in right arm: Secondary | ICD-10-CM | POA: Diagnosis not present

## 2019-10-13 DIAGNOSIS — W19XXXA Unspecified fall, initial encounter: Secondary | ICD-10-CM | POA: Diagnosis not present

## 2019-10-13 DIAGNOSIS — S52134A Nondisplaced fracture of neck of right radius, initial encounter for closed fracture: Secondary | ICD-10-CM | POA: Diagnosis not present

## 2019-10-14 DIAGNOSIS — S52131A Displaced fracture of neck of right radius, initial encounter for closed fracture: Secondary | ICD-10-CM | POA: Diagnosis not present

## 2019-10-21 DIAGNOSIS — C44722 Squamous cell carcinoma of skin of right lower limb, including hip: Secondary | ICD-10-CM | POA: Diagnosis not present

## 2019-10-21 DIAGNOSIS — L820 Inflamed seborrheic keratosis: Secondary | ICD-10-CM | POA: Diagnosis not present

## 2019-10-26 ENCOUNTER — Other Ambulatory Visit (HOSPITAL_COMMUNITY): Payer: Self-pay | Admitting: Family Medicine

## 2019-10-26 DIAGNOSIS — I739 Peripheral vascular disease, unspecified: Secondary | ICD-10-CM

## 2019-10-27 ENCOUNTER — Ambulatory Visit (HOSPITAL_COMMUNITY)
Admission: RE | Admit: 2019-10-27 | Discharge: 2019-10-27 | Disposition: A | Discharge status: Home / Self Care | Payer: Medicare HMO | Source: Ambulatory Visit | Attending: Family Medicine | Admitting: Family Medicine

## 2019-10-27 ENCOUNTER — Other Ambulatory Visit: Payer: Self-pay

## 2019-10-27 DIAGNOSIS — I739 Peripheral vascular disease, unspecified: Secondary | ICD-10-CM

## 2019-11-17 DIAGNOSIS — C44722 Squamous cell carcinoma of skin of right lower limb, including hip: Secondary | ICD-10-CM | POA: Diagnosis not present

## 2019-11-24 DIAGNOSIS — C44722 Squamous cell carcinoma of skin of right lower limb, including hip: Secondary | ICD-10-CM | POA: Diagnosis not present

## 2019-11-30 DIAGNOSIS — S81801A Unspecified open wound, right lower leg, initial encounter: Secondary | ICD-10-CM | POA: Diagnosis not present

## 2019-11-30 DIAGNOSIS — E538 Deficiency of other specified B group vitamins: Secondary | ICD-10-CM | POA: Diagnosis not present

## 2019-11-30 DIAGNOSIS — I10 Essential (primary) hypertension: Secondary | ICD-10-CM | POA: Diagnosis not present

## 2019-11-30 DIAGNOSIS — Z139 Encounter for screening, unspecified: Secondary | ICD-10-CM | POA: Diagnosis not present

## 2019-11-30 DIAGNOSIS — Z23 Encounter for immunization: Secondary | ICD-10-CM | POA: Diagnosis not present

## 2019-11-30 DIAGNOSIS — R69 Illness, unspecified: Secondary | ICD-10-CM | POA: Diagnosis not present

## 2019-11-30 DIAGNOSIS — E785 Hyperlipidemia, unspecified: Secondary | ICD-10-CM | POA: Diagnosis not present

## 2019-11-30 DIAGNOSIS — E559 Vitamin D deficiency, unspecified: Secondary | ICD-10-CM | POA: Diagnosis not present

## 2020-01-11 DIAGNOSIS — L820 Inflamed seborrheic keratosis: Secondary | ICD-10-CM | POA: Diagnosis not present

## 2020-01-11 DIAGNOSIS — C44622 Squamous cell carcinoma of skin of right upper limb, including shoulder: Secondary | ICD-10-CM | POA: Diagnosis not present

## 2020-01-18 DIAGNOSIS — C44722 Squamous cell carcinoma of skin of right lower limb, including hip: Secondary | ICD-10-CM | POA: Diagnosis not present

## 2020-01-19 DIAGNOSIS — I10 Essential (primary) hypertension: Secondary | ICD-10-CM | POA: Diagnosis not present

## 2020-01-19 DIAGNOSIS — M810 Age-related osteoporosis without current pathological fracture: Secondary | ICD-10-CM | POA: Diagnosis not present

## 2020-01-19 DIAGNOSIS — G8929 Other chronic pain: Secondary | ICD-10-CM | POA: Diagnosis not present

## 2020-01-19 DIAGNOSIS — M1990 Unspecified osteoarthritis, unspecified site: Secondary | ICD-10-CM | POA: Diagnosis not present

## 2020-01-19 DIAGNOSIS — M5440 Lumbago with sciatica, unspecified side: Secondary | ICD-10-CM | POA: Diagnosis not present

## 2020-01-19 DIAGNOSIS — E785 Hyperlipidemia, unspecified: Secondary | ICD-10-CM | POA: Diagnosis not present

## 2020-01-19 DIAGNOSIS — Z008 Encounter for other general examination: Secondary | ICD-10-CM | POA: Diagnosis not present

## 2020-01-19 DIAGNOSIS — I87309 Chronic venous hypertension (idiopathic) without complications of unspecified lower extremity: Secondary | ICD-10-CM | POA: Diagnosis not present

## 2020-01-19 DIAGNOSIS — R69 Illness, unspecified: Secondary | ICD-10-CM | POA: Diagnosis not present

## 2020-01-19 DIAGNOSIS — I739 Peripheral vascular disease, unspecified: Secondary | ICD-10-CM | POA: Diagnosis not present

## 2020-01-19 DIAGNOSIS — I951 Orthostatic hypotension: Secondary | ICD-10-CM | POA: Diagnosis not present

## 2020-02-07 DIAGNOSIS — Z0000 Encounter for general adult medical examination without abnormal findings: Secondary | ICD-10-CM | POA: Diagnosis not present

## 2020-02-07 DIAGNOSIS — Z1331 Encounter for screening for depression: Secondary | ICD-10-CM | POA: Diagnosis not present

## 2020-02-07 DIAGNOSIS — Z9181 History of falling: Secondary | ICD-10-CM | POA: Diagnosis not present

## 2020-02-07 DIAGNOSIS — E785 Hyperlipidemia, unspecified: Secondary | ICD-10-CM | POA: Diagnosis not present

## 2020-02-14 DIAGNOSIS — Z1231 Encounter for screening mammogram for malignant neoplasm of breast: Secondary | ICD-10-CM | POA: Diagnosis not present

## 2020-03-01 DIAGNOSIS — E559 Vitamin D deficiency, unspecified: Secondary | ICD-10-CM | POA: Diagnosis not present

## 2020-03-01 DIAGNOSIS — I739 Peripheral vascular disease, unspecified: Secondary | ICD-10-CM | POA: Diagnosis not present

## 2020-03-01 DIAGNOSIS — R32 Unspecified urinary incontinence: Secondary | ICD-10-CM | POA: Diagnosis not present

## 2020-03-01 DIAGNOSIS — E538 Deficiency of other specified B group vitamins: Secondary | ICD-10-CM | POA: Diagnosis not present

## 2020-03-01 DIAGNOSIS — E785 Hyperlipidemia, unspecified: Secondary | ICD-10-CM | POA: Diagnosis not present

## 2020-03-01 DIAGNOSIS — R194 Change in bowel habit: Secondary | ICD-10-CM | POA: Diagnosis not present

## 2020-03-01 DIAGNOSIS — I10 Essential (primary) hypertension: Secondary | ICD-10-CM | POA: Diagnosis not present

## 2020-03-01 DIAGNOSIS — Z6829 Body mass index (BMI) 29.0-29.9, adult: Secondary | ICD-10-CM | POA: Diagnosis not present

## 2020-03-29 DIAGNOSIS — N3281 Overactive bladder: Secondary | ICD-10-CM | POA: Diagnosis not present

## 2020-03-29 DIAGNOSIS — N390 Urinary tract infection, site not specified: Secondary | ICD-10-CM | POA: Diagnosis not present

## 2020-03-29 DIAGNOSIS — N952 Postmenopausal atrophic vaginitis: Secondary | ICD-10-CM | POA: Diagnosis not present

## 2020-03-29 DIAGNOSIS — Z79899 Other long term (current) drug therapy: Secondary | ICD-10-CM | POA: Diagnosis not present

## 2020-04-13 DIAGNOSIS — M7061 Trochanteric bursitis, right hip: Secondary | ICD-10-CM | POA: Diagnosis not present

## 2020-05-11 DIAGNOSIS — N952 Postmenopausal atrophic vaginitis: Secondary | ICD-10-CM | POA: Diagnosis not present

## 2020-05-11 DIAGNOSIS — N3281 Overactive bladder: Secondary | ICD-10-CM | POA: Diagnosis not present

## 2020-05-11 DIAGNOSIS — R12 Heartburn: Secondary | ICD-10-CM | POA: Diagnosis not present

## 2020-05-11 DIAGNOSIS — R1084 Generalized abdominal pain: Secondary | ICD-10-CM | POA: Diagnosis not present

## 2020-05-11 DIAGNOSIS — N390 Urinary tract infection, site not specified: Secondary | ICD-10-CM | POA: Diagnosis not present

## 2020-05-15 DIAGNOSIS — C44722 Squamous cell carcinoma of skin of right lower limb, including hip: Secondary | ICD-10-CM | POA: Diagnosis not present

## 2020-05-15 DIAGNOSIS — L728 Other follicular cysts of the skin and subcutaneous tissue: Secondary | ICD-10-CM | POA: Diagnosis not present

## 2020-05-15 DIAGNOSIS — L570 Actinic keratosis: Secondary | ICD-10-CM | POA: Diagnosis not present

## 2020-06-01 DIAGNOSIS — I739 Peripheral vascular disease, unspecified: Secondary | ICD-10-CM | POA: Diagnosis not present

## 2020-06-01 DIAGNOSIS — Z6829 Body mass index (BMI) 29.0-29.9, adult: Secondary | ICD-10-CM | POA: Diagnosis not present

## 2020-06-01 DIAGNOSIS — G8929 Other chronic pain: Secondary | ICD-10-CM | POA: Diagnosis not present

## 2020-06-01 DIAGNOSIS — R69 Illness, unspecified: Secondary | ICD-10-CM | POA: Diagnosis not present

## 2020-06-01 DIAGNOSIS — I10 Essential (primary) hypertension: Secondary | ICD-10-CM | POA: Diagnosis not present

## 2020-06-01 DIAGNOSIS — E559 Vitamin D deficiency, unspecified: Secondary | ICD-10-CM | POA: Diagnosis not present

## 2020-06-01 DIAGNOSIS — E538 Deficiency of other specified B group vitamins: Secondary | ICD-10-CM | POA: Diagnosis not present

## 2020-06-01 DIAGNOSIS — E785 Hyperlipidemia, unspecified: Secondary | ICD-10-CM | POA: Diagnosis not present

## 2020-06-01 DIAGNOSIS — M549 Dorsalgia, unspecified: Secondary | ICD-10-CM | POA: Diagnosis not present

## 2020-06-15 DIAGNOSIS — I8312 Varicose veins of left lower extremity with inflammation: Secondary | ICD-10-CM | POA: Diagnosis not present

## 2020-06-15 DIAGNOSIS — I8311 Varicose veins of right lower extremity with inflammation: Secondary | ICD-10-CM | POA: Diagnosis not present

## 2020-06-15 DIAGNOSIS — I83893 Varicose veins of bilateral lower extremities with other complications: Secondary | ICD-10-CM | POA: Diagnosis not present

## 2020-06-19 DIAGNOSIS — I10 Essential (primary) hypertension: Secondary | ICD-10-CM | POA: Diagnosis not present

## 2020-06-19 DIAGNOSIS — I129 Hypertensive chronic kidney disease with stage 1 through stage 4 chronic kidney disease, or unspecified chronic kidney disease: Secondary | ICD-10-CM | POA: Diagnosis not present

## 2020-06-19 DIAGNOSIS — R69 Illness, unspecified: Secondary | ICD-10-CM | POA: Diagnosis not present

## 2020-06-19 DIAGNOSIS — N183 Chronic kidney disease, stage 3 unspecified: Secondary | ICD-10-CM | POA: Diagnosis not present

## 2020-06-20 DIAGNOSIS — S22080A Wedge compression fracture of T11-T12 vertebra, initial encounter for closed fracture: Secondary | ICD-10-CM | POA: Diagnosis not present

## 2020-06-20 DIAGNOSIS — S32010A Wedge compression fracture of first lumbar vertebra, initial encounter for closed fracture: Secondary | ICD-10-CM | POA: Diagnosis not present

## 2020-06-23 DIAGNOSIS — M4314 Spondylolisthesis, thoracic region: Secondary | ICD-10-CM | POA: Diagnosis not present

## 2020-06-23 DIAGNOSIS — S22080A Wedge compression fracture of T11-T12 vertebra, initial encounter for closed fracture: Secondary | ICD-10-CM | POA: Diagnosis not present

## 2020-06-23 DIAGNOSIS — S22069A Unspecified fracture of T7-T8 vertebra, initial encounter for closed fracture: Secondary | ICD-10-CM | POA: Diagnosis not present

## 2020-06-23 DIAGNOSIS — R079 Chest pain, unspecified: Secondary | ICD-10-CM | POA: Diagnosis not present

## 2020-06-23 DIAGNOSIS — M5124 Other intervertebral disc displacement, thoracic region: Secondary | ICD-10-CM | POA: Diagnosis not present

## 2020-06-23 DIAGNOSIS — X58XXXA Exposure to other specified factors, initial encounter: Secondary | ICD-10-CM | POA: Diagnosis not present

## 2020-06-27 DIAGNOSIS — M545 Low back pain, unspecified: Secondary | ICD-10-CM | POA: Diagnosis not present

## 2020-07-04 DIAGNOSIS — M47816 Spondylosis without myelopathy or radiculopathy, lumbar region: Secondary | ICD-10-CM | POA: Diagnosis not present

## 2020-07-04 DIAGNOSIS — Z6830 Body mass index (BMI) 30.0-30.9, adult: Secondary | ICD-10-CM | POA: Diagnosis not present

## 2020-07-04 DIAGNOSIS — I10 Essential (primary) hypertension: Secondary | ICD-10-CM | POA: Diagnosis not present

## 2020-07-05 DIAGNOSIS — I8311 Varicose veins of right lower extremity with inflammation: Secondary | ICD-10-CM | POA: Diagnosis not present

## 2020-07-05 DIAGNOSIS — I8312 Varicose veins of left lower extremity with inflammation: Secondary | ICD-10-CM | POA: Diagnosis not present

## 2020-07-07 DIAGNOSIS — N189 Chronic kidney disease, unspecified: Secondary | ICD-10-CM | POA: Diagnosis not present

## 2020-07-07 DIAGNOSIS — N183 Chronic kidney disease, stage 3 unspecified: Secondary | ICD-10-CM | POA: Diagnosis not present

## 2020-07-07 DIAGNOSIS — I129 Hypertensive chronic kidney disease with stage 1 through stage 4 chronic kidney disease, or unspecified chronic kidney disease: Secondary | ICD-10-CM | POA: Diagnosis not present

## 2020-07-14 DIAGNOSIS — I8312 Varicose veins of left lower extremity with inflammation: Secondary | ICD-10-CM | POA: Diagnosis not present

## 2020-07-14 DIAGNOSIS — I83893 Varicose veins of bilateral lower extremities with other complications: Secondary | ICD-10-CM | POA: Diagnosis not present

## 2020-07-14 DIAGNOSIS — I8311 Varicose veins of right lower extremity with inflammation: Secondary | ICD-10-CM | POA: Diagnosis not present

## 2020-07-24 DIAGNOSIS — I10 Essential (primary) hypertension: Secondary | ICD-10-CM | POA: Diagnosis not present

## 2020-07-24 DIAGNOSIS — S81801A Unspecified open wound, right lower leg, initial encounter: Secondary | ICD-10-CM | POA: Diagnosis not present

## 2020-07-24 DIAGNOSIS — Z6829 Body mass index (BMI) 29.0-29.9, adult: Secondary | ICD-10-CM | POA: Diagnosis not present

## 2020-07-31 DIAGNOSIS — Z6829 Body mass index (BMI) 29.0-29.9, adult: Secondary | ICD-10-CM | POA: Diagnosis not present

## 2020-07-31 DIAGNOSIS — I6522 Occlusion and stenosis of left carotid artery: Secondary | ICD-10-CM | POA: Diagnosis not present

## 2020-07-31 DIAGNOSIS — I10 Essential (primary) hypertension: Secondary | ICD-10-CM | POA: Diagnosis not present

## 2020-07-31 DIAGNOSIS — Z9889 Other specified postprocedural states: Secondary | ICD-10-CM | POA: Diagnosis not present

## 2020-07-31 DIAGNOSIS — S81801A Unspecified open wound, right lower leg, initial encounter: Secondary | ICD-10-CM | POA: Diagnosis not present

## 2020-08-03 ENCOUNTER — Telehealth: Payer: Self-pay

## 2020-08-03 NOTE — Telephone Encounter (Signed)
Spoke to pt's daughter in law to make appt. Pt has recently had carotid u/s and renal u/s at an outside facility. I have made them aware that we can not see these studies in Epic and they are to bring them with them. Daughter in law is aware and will get them for appt. So MD can view them.

## 2020-08-08 ENCOUNTER — Encounter: Payer: Self-pay | Admitting: Vascular Surgery

## 2020-08-08 ENCOUNTER — Other Ambulatory Visit: Payer: Self-pay

## 2020-08-08 ENCOUNTER — Ambulatory Visit (INDEPENDENT_AMBULATORY_CARE_PROVIDER_SITE_OTHER): Payer: Medicare HMO | Admitting: Vascular Surgery

## 2020-08-08 DIAGNOSIS — I701 Atherosclerosis of renal artery: Secondary | ICD-10-CM | POA: Diagnosis not present

## 2020-08-08 DIAGNOSIS — S81801A Unspecified open wound, right lower leg, initial encounter: Secondary | ICD-10-CM | POA: Diagnosis not present

## 2020-08-08 HISTORY — DX: Atherosclerosis of renal artery: I70.1
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
## 2020-08-10 ENCOUNTER — Other Ambulatory Visit: Payer: Self-pay

## 2020-08-14 DIAGNOSIS — D485 Neoplasm of uncertain behavior of skin: Secondary | ICD-10-CM | POA: Diagnosis not present

## 2020-08-15 DIAGNOSIS — S81801A Unspecified open wound, right lower leg, initial encounter: Secondary | ICD-10-CM | POA: Diagnosis not present

## 2020-08-17 ENCOUNTER — Encounter (HOSPITAL_COMMUNITY): Payer: Self-pay | Admitting: Vascular Surgery

## 2020-08-17 ENCOUNTER — Encounter (HOSPITAL_COMMUNITY)
Admission: RE | Disposition: A | Discharge status: Home / Self Care | Payer: Self-pay | Source: Ambulatory Visit | Attending: Vascular Surgery

## 2020-08-17 ENCOUNTER — Other Ambulatory Visit: Payer: Self-pay

## 2020-08-17 ENCOUNTER — Ambulatory Visit (HOSPITAL_COMMUNITY)
Admission: RE | Admit: 2020-08-17 | Discharge: 2020-08-17 | Disposition: A | Discharge status: Home / Self Care | Payer: Medicare HMO | Source: Ambulatory Visit | Attending: Vascular Surgery | Admitting: Vascular Surgery

## 2020-08-17 DIAGNOSIS — I10 Essential (primary) hypertension: Secondary | ICD-10-CM | POA: Insufficient documentation

## 2020-08-17 DIAGNOSIS — E785 Hyperlipidemia, unspecified: Secondary | ICD-10-CM | POA: Diagnosis not present

## 2020-08-17 DIAGNOSIS — Z880 Allergy status to penicillin: Secondary | ICD-10-CM | POA: Insufficient documentation

## 2020-08-17 DIAGNOSIS — I6522 Occlusion and stenosis of left carotid artery: Secondary | ICD-10-CM | POA: Diagnosis not present

## 2020-08-17 DIAGNOSIS — I701 Atherosclerosis of renal artery: Secondary | ICD-10-CM | POA: Diagnosis not present

## 2020-08-17 DIAGNOSIS — Z79899 Other long term (current) drug therapy: Secondary | ICD-10-CM | POA: Insufficient documentation

## 2020-08-17 HISTORY — PX: ABDOMINAL AORTOGRAM: CATH118222

## 2020-08-17 LAB — POCT I-STAT, CHEM 8
BUN: 57 mg/dL — ABNORMAL HIGH (ref 8–23)
Calcium, Ion: 1.36 mmol/L (ref 1.15–1.40)
Chloride: 105 mmol/L (ref 98–111)
Creatinine, Ser: 1.2 mg/dL — ABNORMAL HIGH (ref 0.44–1.00)
Glucose, Bld: 98 mg/dL (ref 70–99)
HCT: 43 % (ref 36.0–46.0)
Hemoglobin: 14.6 g/dL (ref 12.0–15.0)
Potassium: 4.9 mmol/L (ref 3.5–5.1)
Sodium: 140 mmol/L (ref 135–145)
TCO2: 35 mmol/L — ABNORMAL HIGH (ref 22–32)

## 2020-08-17 SURGERY — ABDOMINAL AORTOGRAM
Anesthesia: LOCAL

## 2020-08-17 MED ORDER — SODIUM CHLORIDE 0.9% FLUSH: 3.0000 mL | Freq: Two times a day (BID) | INTRAVENOUS | Status: DC

## 2020-08-17 MED ORDER — HEPARIN (PORCINE) IN NACL 1000-0.9 UT/500ML-% IV SOLN
INTRAVENOUS | Status: AC
  Filled 2020-08-17: qty 500

## 2020-08-17 MED ORDER — LABETALOL HCL 5 MG/ML IV SOLN
INTRAVENOUS | Status: DC | PRN
Start: 2020-08-17 — End: 2020-08-17
  Administered 2020-08-17: 10 mg via INTRAVENOUS

## 2020-08-17 MED ORDER — HEPARIN (PORCINE) IN NACL 1000-0.9 UT/500ML-% IV SOLN
INTRAVENOUS | Status: DC | PRN
  Administered 2020-08-17 (×2): 500 mL

## 2020-08-17 MED ORDER — IODIXANOL 320 MG/ML IV SOLN
INTRAVENOUS | Status: DC | PRN
  Administered 2020-08-17: 70 mL via INTRA_ARTERIAL

## 2020-08-17 MED ORDER — SODIUM CHLORIDE 0.9 % IV SOLN: INTRAVENOUS | Status: DC

## 2020-08-17 MED ORDER — LABETALOL HCL 5 MG/ML IV SOLN
INTRAVENOUS | Status: AC
  Filled 2020-08-17: qty 4

## 2020-08-17 MED ORDER — LIDOCAINE HCL (PF) 1 % IJ SOLN
INTRAMUSCULAR | Status: AC
  Filled 2020-08-17: qty 30

## 2020-08-17 MED ORDER — FENTANYL CITRATE (PF) 100 MCG/2ML IJ SOLN
INTRAMUSCULAR | Status: AC
  Filled 2020-08-17: qty 2

## 2020-08-17 MED ORDER — ONDANSETRON HCL 4 MG/2ML IJ SOLN: 4.0000 mg | Freq: Four times a day (QID) | INTRAMUSCULAR | Status: DC | PRN

## 2020-08-17 MED ORDER — HYDRALAZINE HCL 20 MG/ML IJ SOLN
5.0000 mg | INTRAMUSCULAR | Status: AC | PRN
  Administered 2020-08-17 (×2): 5 mg via INTRAVENOUS
  Filled 2020-08-17: qty 1

## 2020-08-17 MED ORDER — SODIUM CHLORIDE 0.9% FLUSH: 3.0000 mL | INTRAVENOUS | Status: DC | PRN

## 2020-08-17 MED ORDER — ACETAMINOPHEN 325 MG PO TABS: 650.0000 mg | ORAL_TABLET | ORAL | Status: DC | PRN

## 2020-08-17 MED ORDER — LIDOCAINE HCL (PF) 1 % IJ SOLN
INTRAMUSCULAR | Status: DC | PRN
  Administered 2020-08-17: 15 mL via INTRADERMAL

## 2020-08-17 MED ORDER — MIDAZOLAM HCL 2 MG/2ML IJ SOLN
INTRAMUSCULAR | Status: AC
  Filled 2020-08-17: qty 2

## 2020-08-17 MED ORDER — SODIUM CHLORIDE 0.9 % IV SOLN: 250.0000 mL | INTRAVENOUS | Status: DC | PRN

## 2020-08-17 MED ORDER — FENTANYL CITRATE (PF) 100 MCG/2ML IJ SOLN
INTRAMUSCULAR | Status: DC | PRN
  Administered 2020-08-17: 25 ug via INTRAVENOUS

## 2020-08-17 MED ORDER — MIDAZOLAM HCL 2 MG/2ML IJ SOLN
INTRAMUSCULAR | Status: DC | PRN
  Administered 2020-08-17: 1 mg via INTRAVENOUS

## 2020-08-17 MED ORDER — LABETALOL HCL 5 MG/ML IV SOLN
10.0000 mg | INTRAVENOUS | Status: DC | PRN
  Administered 2020-08-17: 10 mg via INTRAVENOUS

## 2020-08-17 MED ORDER — SODIUM CHLORIDE 0.9 % IV SOLN: INTRAVENOUS | Status: AC

## 2020-08-17 SURGICAL SUPPLY — 9 items
CATH OMNI FLUSH 5F 65CM (CATHETERS) ×2 IMPLANT
KIT MICROPUNCTURE NIT STIFF (SHEATH) ×2 IMPLANT
KIT PV (KITS) ×2 IMPLANT
SHEATH PINNACLE 5F 10CM (SHEATH) ×2 IMPLANT
SHEATH PROBE COVER 6X72 (BAG) ×2 IMPLANT
SYR MEDRAD MARK V 150ML (SYRINGE) ×2 IMPLANT
TRANSDUCER W/STOPCOCK (MISCELLANEOUS) ×2 IMPLANT
TRAY PV CATH (CUSTOM PROCEDURE TRAY) ×2 IMPLANT
WIRE STARTER BENTSON 035X150 (WIRE) ×2 IMPLANT

## 2020-08-17 NOTE — Progress Notes (Signed)
Pt ambulated without difficulty or bleeding.   Discharged home with her son who will drive and stay with pt x 24 hrs.

## 2020-08-17 NOTE — Op Note (Signed)
    Patient name: Carolyn [name] MRN: [id number] DOB: Jan 13, 1935 Sex: female  08/17/2020 Pre-operative Diagnosis: Concern for high grade left renal artery stenosis Post-operative diagnosis:  Same Surgeon:  Marty Heck, MD Procedure Performed: 1.  Ultrasound-guided access right common femoral artery 2.  Abdominal aortogram including bilateral renal artery arteriogram 3.  22 minutes of monitored moderate conscious sedation time  Indications: Patient is an 85 year old female recently seen in the office with uncontrolled hypertension now on 4 agents and a question of a high-grade left renal artery stenosis greater than 60% on renal ultrasound.  She presents today for renal arteriogram and possible intervention after risk benefits discussed.  Findings:  The left renal artery had no evidence of high-grade stenosis and only demonstrated about a 20% stenosis at the ostium.  The right renal artery which did not have any evidence of high-grade stenosis on recent duplex had evidence of about a 30% stenosis.  No renal artery intervention was performed given no critical stenosis was identified.   Procedure:  The patient was identified in the holding area and taken to room 8.  The patient was then placed supine on the table and prepped and draped in the usual sterile fashion.  A time out was called.  Ultrasound was used to evaluate the right common femoral artery.  It was patent .  A digital ultrasound image was acquired.  A micropuncture needle was used to access the right common femoral artery under ultrasound guidance.  An 018 wire was advanced without resistance and a micropuncture sheath was placed.  The 018 wire was removed and a benson wire was placed.  The micropuncture sheath was exchanged for a 5 french sheath.  An omniflush catheter was advanced over the wire to the level of L-1.  An abdominal angiogram was obtained.  We then got additional imaging with slightly left and right oblique  orientation of the fluoroscopic C arm to better evaluate the ostium of both renal arteries.  As noted above no critical stenosis was identified.  Wires and catheters were removed.  She was taken to holding to have the sheath removed with manual pressure given significant right common femoral disease.  Plan: Patient can follow-up with her primary care doctor for continued management of her blood pressure.   Marty Heck, MD [facility] [place] Office: [phone]
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
## 2020-08-17 NOTE — Progress Notes (Signed)
Site area: right groin  Site Prior to Removal:  Level 0  Pressure Applied For 20 MINUTES    Minutes Beginning at 0855  Manual:   Yes.    Patient Status During Pull:  stable  Post Pull Groin Site:  Level 0  Post Pull Instructions Given:  Yes.    Post Pull Pulses Present:  Yes.    Dressing Applied:  Yes.    Comments:  Bed rest started at 0920 x 4 hr.

## 2020-08-28 DIAGNOSIS — I83891 Varicose veins of right lower extremities with other complications: Secondary | ICD-10-CM | POA: Diagnosis not present

## 2020-08-28 DIAGNOSIS — I83811 Varicose veins of right lower extremities with pain: Secondary | ICD-10-CM | POA: Diagnosis not present

## 2020-08-28 DIAGNOSIS — I8311 Varicose veins of right lower extremity with inflammation: Secondary | ICD-10-CM | POA: Diagnosis not present

## 2020-08-29 DIAGNOSIS — S81801A Unspecified open wound, right lower leg, initial encounter: Secondary | ICD-10-CM | POA: Diagnosis not present

## 2020-08-30 DIAGNOSIS — I8311 Varicose veins of right lower extremity with inflammation: Secondary | ICD-10-CM | POA: Diagnosis not present

## 2020-09-13 DIAGNOSIS — C44729 Squamous cell carcinoma of skin of left lower limb, including hip: Secondary | ICD-10-CM | POA: Diagnosis not present

## 2020-09-19 DIAGNOSIS — S81801A Unspecified open wound, right lower leg, initial encounter: Secondary | ICD-10-CM | POA: Diagnosis not present

## 2020-09-21 DIAGNOSIS — R69 Illness, unspecified: Secondary | ICD-10-CM | POA: Diagnosis not present

## 2020-09-21 DIAGNOSIS — I10 Essential (primary) hypertension: Secondary | ICD-10-CM | POA: Diagnosis not present

## 2020-09-21 DIAGNOSIS — E538 Deficiency of other specified B group vitamins: Secondary | ICD-10-CM | POA: Diagnosis not present

## 2020-09-21 DIAGNOSIS — E785 Hyperlipidemia, unspecified: Secondary | ICD-10-CM | POA: Diagnosis not present

## 2020-09-21 DIAGNOSIS — Z6829 Body mass index (BMI) 29.0-29.9, adult: Secondary | ICD-10-CM | POA: Diagnosis not present

## 2020-09-21 DIAGNOSIS — E559 Vitamin D deficiency, unspecified: Secondary | ICD-10-CM | POA: Diagnosis not present

## 2020-09-25 DIAGNOSIS — I8311 Varicose veins of right lower extremity with inflammation: Secondary | ICD-10-CM | POA: Diagnosis not present

## 2020-09-25 DIAGNOSIS — I83891 Varicose veins of right lower extremities with other complications: Secondary | ICD-10-CM | POA: Diagnosis not present

## 2020-09-25 DIAGNOSIS — I83811 Varicose veins of right lower extremities with pain: Secondary | ICD-10-CM | POA: Diagnosis not present

## 2020-10-03 ENCOUNTER — Encounter (HOSPITAL_COMMUNITY): Payer: Self-pay | Admitting: Emergency Medicine

## 2020-10-03 ENCOUNTER — Emergency Department (HOSPITAL_COMMUNITY)
Admission: EM | Admit: 2020-10-03 | Discharge: 2020-10-03 | Disposition: A | Discharge status: Home / Self Care | Payer: Medicare HMO | Attending: Emergency Medicine | Admitting: Emergency Medicine

## 2020-10-03 ENCOUNTER — Emergency Department (HOSPITAL_COMMUNITY): Payer: Medicare HMO

## 2020-10-03 ENCOUNTER — Other Ambulatory Visit: Payer: Self-pay

## 2020-10-03 DIAGNOSIS — L7682 Other postprocedural complications of skin and subcutaneous tissue: Secondary | ICD-10-CM | POA: Insufficient documentation

## 2020-10-03 DIAGNOSIS — Y848 Other medical procedures as the cause of abnormal reaction of the patient, or of later complication, without mention of misadventure at the time of the procedure: Secondary | ICD-10-CM | POA: Insufficient documentation

## 2020-10-03 DIAGNOSIS — T8149XA Infection following a procedure, other surgical site, initial encounter: Secondary | ICD-10-CM | POA: Diagnosis not present

## 2020-10-03 DIAGNOSIS — I10 Essential (primary) hypertension: Secondary | ICD-10-CM | POA: Diagnosis not present

## 2020-10-03 DIAGNOSIS — Z79899 Other long term (current) drug therapy: Secondary | ICD-10-CM | POA: Insufficient documentation

## 2020-10-03 DIAGNOSIS — Z4802 Encounter for removal of sutures: Secondary | ICD-10-CM | POA: Insufficient documentation

## 2020-10-03 DIAGNOSIS — Z9889 Other specified postprocedural states: Secondary | ICD-10-CM | POA: Diagnosis not present

## 2020-10-03 LAB — BASIC METABOLIC PANEL
Anion gap: 7 (ref 5–15)
BUN: 36 mg/dL — ABNORMAL HIGH (ref 8–23)
CO2: 25 mmol/L (ref 22–32)
Calcium: 9.3 mg/dL (ref 8.9–10.3)
Chloride: 105 mmol/L (ref 98–111)
Creatinine, Ser: 1.42 mg/dL — ABNORMAL HIGH (ref 0.44–1.00)
GFR, Estimated: 37 mL/min — ABNORMAL LOW (ref >60)
Glucose, Bld: 107 mg/dL — ABNORMAL HIGH (ref 70–99)
Potassium: 3.8 mmol/L (ref 3.5–5.1)
Sodium: 137 mmol/L (ref 135–145)

## 2020-10-03 LAB — CBC WITH DIFFERENTIAL/PLATELET
Abs Immature Granulocytes: 0.01 10*3/uL (ref 0.00–0.07)
Basophils Absolute: 0.1 10*3/uL (ref 0.0–0.1)
Basophils Relative: 1 %
Eosinophils Absolute: 0.3 10*3/uL (ref 0.0–0.5)
Eosinophils Relative: 4 %
HCT: 41.6 % (ref 36.0–46.0)
Hemoglobin: 12.8 g/dL (ref 12.0–15.0)
Immature Granulocytes: 0 %
Lymphocytes Relative: 22 %
Lymphs Abs: 1.5 10*3/uL (ref 0.7–4.0)
MCH: 29.4 pg (ref 26.0–34.0)
MCHC: 30.8 g/dL (ref 30.0–36.0)
MCV: 95.6 fL (ref 80.0–100.0)
Monocytes Absolute: 0.7 10*3/uL (ref 0.1–1.0)
Monocytes Relative: 10 %
Neutro Abs: 4.4 10*3/uL (ref 1.7–7.7)
Neutrophils Relative %: 63 %
Platelets: 122 10*3/uL — ABNORMAL LOW (ref 150–400)
RBC: 4.35 MIL/uL (ref 3.87–5.11)
RDW: 13.4 % (ref 11.5–15.5)
WBC: 7 10*3/uL (ref 4.0–10.5)
nRBC: 0 % (ref 0.0–0.2)

## 2020-10-03 MED ORDER — DOXYCYCLINE HYCLATE 100 MG PO CAPS: 100.0000 mg | ORAL_CAPSULE | Freq: Two times a day (BID) | ORAL | 0 refills | Status: DC

## 2020-10-03 NOTE — Discharge Instructions (Signed)
Please take antibiotic prescribed as treatment for potential early signs of wound infection.  Follow-up closely with your dermatologist for wound recheck.  Return if you have any concern.
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
## 2020-10-03 NOTE — ED Provider Notes (Signed)
Emergency Medicine Provider Triage Evaluation Note  Carolyn [name] , a 85 y.o. female  was evaluated in triage.  Pt complains of swelling to the RLE associated with the site of recent surgery to remove a cancerous lesion. Surgery was on 09/13/20. Patient reporting increased swelling, erythema since Saturday with foul smell coming from the site. No associated fevers. Took abx preop, but not postop. No associated trauma or injury. Surgery done in [place].  Review of Systems  Positive: Postoperative wound Negative: Fevers   Physical Exam  BP (!) 126/99 (BP Location: Left Arm)   Pulse 62   Resp 18   SpO2 97%  Gen:   Awake, no distress   Resp:  Normal effort  MSK:   Moves extremities without difficulty  Other:  Stitches to the anterior LLE at site of prior skin excision. Surrounding erythema with some edema. No purulence or crepitus.  Medical Decision Making  Medically screening exam initiated at 2:33 AM.  Appropriate orders placed.  Carolyn [name] was informed that the remainder of the evaluation will be completed by another provider, this initial triage assessment does not replace that evaluation, and the importance of remaining in the ED until their evaluation is complete.  Postoperative complication   Antonietta Breach, PA-C 10/03/20 0235    Merryl Hacker, MD 10/03/20 [phone]

## 2020-10-03 NOTE — ED Triage Notes (Addendum)
Patient noticed malodorous drainage with redness/swelling at excision site of skin cancer removed last Sept.7 at left lower leg , sutures intact , denies fever or chills .

## 2020-10-09 DIAGNOSIS — I8311 Varicose veins of right lower extremity with inflammation: Secondary | ICD-10-CM | POA: Diagnosis not present

## 2020-10-17 DIAGNOSIS — N289 Disorder of kidney and ureter, unspecified: Secondary | ICD-10-CM | POA: Diagnosis not present

## 2020-10-25 DIAGNOSIS — I8311 Varicose veins of right lower extremity with inflammation: Secondary | ICD-10-CM | POA: Diagnosis not present

## 2020-11-16 DIAGNOSIS — I872 Venous insufficiency (chronic) (peripheral): Secondary | ICD-10-CM | POA: Diagnosis not present

## 2020-11-16 DIAGNOSIS — I83812 Varicose veins of left lower extremities with pain: Secondary | ICD-10-CM | POA: Diagnosis not present

## 2020-11-16 DIAGNOSIS — I8312 Varicose veins of left lower extremity with inflammation: Secondary | ICD-10-CM | POA: Diagnosis not present

## 2020-11-16 DIAGNOSIS — Z09 Encounter for follow-up examination after completed treatment for conditions other than malignant neoplasm: Secondary | ICD-10-CM | POA: Diagnosis not present

## 2020-12-12 DIAGNOSIS — M7989 Other specified soft tissue disorders: Secondary | ICD-10-CM | POA: Diagnosis not present

## 2020-12-12 DIAGNOSIS — I83812 Varicose veins of left lower extremities with pain: Secondary | ICD-10-CM | POA: Diagnosis not present

## 2020-12-12 DIAGNOSIS — I83892 Varicose veins of left lower extremities with other complications: Secondary | ICD-10-CM | POA: Diagnosis not present

## 2020-12-27 DIAGNOSIS — E538 Deficiency of other specified B group vitamins: Secondary | ICD-10-CM | POA: Diagnosis not present

## 2020-12-27 DIAGNOSIS — E559 Vitamin D deficiency, unspecified: Secondary | ICD-10-CM | POA: Diagnosis not present

## 2020-12-27 DIAGNOSIS — E785 Hyperlipidemia, unspecified: Secondary | ICD-10-CM | POA: Diagnosis not present

## 2020-12-27 DIAGNOSIS — R69 Illness, unspecified: Secondary | ICD-10-CM | POA: Diagnosis not present

## 2020-12-27 DIAGNOSIS — Z6829 Body mass index (BMI) 29.0-29.9, adult: Secondary | ICD-10-CM | POA: Diagnosis not present

## 2020-12-27 DIAGNOSIS — I739 Peripheral vascular disease, unspecified: Secondary | ICD-10-CM | POA: Diagnosis not present

## 2020-12-27 DIAGNOSIS — I10 Essential (primary) hypertension: Secondary | ICD-10-CM | POA: Diagnosis not present

## 2020-12-27 DIAGNOSIS — K219 Gastro-esophageal reflux disease without esophagitis: Secondary | ICD-10-CM | POA: Diagnosis not present

## 2020-12-27 DIAGNOSIS — A084 Viral intestinal infection, unspecified: Secondary | ICD-10-CM | POA: Diagnosis not present

## 2021-01-09 DIAGNOSIS — I8312 Varicose veins of left lower extremity with inflammation: Secondary | ICD-10-CM | POA: Diagnosis not present

## 2021-01-25 DIAGNOSIS — L570 Actinic keratosis: Secondary | ICD-10-CM | POA: Diagnosis not present

## 2021-01-25 DIAGNOSIS — D0439 Carcinoma in situ of skin of other parts of face: Secondary | ICD-10-CM | POA: Diagnosis not present

## 2021-01-29 DIAGNOSIS — E538 Deficiency of other specified B group vitamins: Secondary | ICD-10-CM | POA: Diagnosis not present

## 2021-02-07 DIAGNOSIS — I8311 Varicose veins of right lower extremity with inflammation: Secondary | ICD-10-CM | POA: Diagnosis not present

## 2021-02-14 DIAGNOSIS — Z1231 Encounter for screening mammogram for malignant neoplasm of breast: Secondary | ICD-10-CM | POA: Diagnosis not present

## 2021-02-20 DIAGNOSIS — H524 Presbyopia: Secondary | ICD-10-CM | POA: Diagnosis not present

## 2021-02-28 DIAGNOSIS — I8311 Varicose veins of right lower extremity with inflammation: Secondary | ICD-10-CM | POA: Diagnosis not present

## 2021-03-01 DIAGNOSIS — E538 Deficiency of other specified B group vitamins: Secondary | ICD-10-CM | POA: Diagnosis not present

## 2021-03-21 DIAGNOSIS — I8311 Varicose veins of right lower extremity with inflammation: Secondary | ICD-10-CM | POA: Diagnosis not present

## 2021-03-27 DIAGNOSIS — I10 Essential (primary) hypertension: Secondary | ICD-10-CM | POA: Diagnosis not present

## 2021-03-27 DIAGNOSIS — E559 Vitamin D deficiency, unspecified: Secondary | ICD-10-CM | POA: Diagnosis not present

## 2021-03-27 DIAGNOSIS — Z6829 Body mass index (BMI) 29.0-29.9, adult: Secondary | ICD-10-CM | POA: Diagnosis not present

## 2021-03-27 DIAGNOSIS — E538 Deficiency of other specified B group vitamins: Secondary | ICD-10-CM | POA: Diagnosis not present

## 2021-03-27 DIAGNOSIS — R69 Illness, unspecified: Secondary | ICD-10-CM | POA: Diagnosis not present

## 2021-03-27 DIAGNOSIS — I701 Atherosclerosis of renal artery: Secondary | ICD-10-CM | POA: Diagnosis not present

## 2021-03-27 DIAGNOSIS — E785 Hyperlipidemia, unspecified: Secondary | ICD-10-CM | POA: Diagnosis not present

## 2021-03-27 DIAGNOSIS — I739 Peripheral vascular disease, unspecified: Secondary | ICD-10-CM | POA: Diagnosis not present

## 2021-04-02 DIAGNOSIS — E538 Deficiency of other specified B group vitamins: Secondary | ICD-10-CM | POA: Diagnosis not present

## 2021-05-03 DIAGNOSIS — E538 Deficiency of other specified B group vitamins: Secondary | ICD-10-CM | POA: Diagnosis not present

## 2021-05-24 ENCOUNTER — Encounter (HOSPITAL_COMMUNITY): Payer: Self-pay | Admitting: Emergency Medicine

## 2021-05-24 ENCOUNTER — Emergency Department (HOSPITAL_COMMUNITY)
Admission: EM | Admit: 2021-05-24 | Discharge: 2021-05-25 | Disposition: A | Discharge status: Home / Self Care | Payer: Medicare HMO | Attending: Emergency Medicine | Admitting: Emergency Medicine

## 2021-05-24 ENCOUNTER — Emergency Department (HOSPITAL_COMMUNITY): Payer: Medicare HMO

## 2021-05-24 ENCOUNTER — Other Ambulatory Visit: Payer: Self-pay

## 2021-05-24 DIAGNOSIS — R519 Headache, unspecified: Secondary | ICD-10-CM | POA: Diagnosis not present

## 2021-05-24 DIAGNOSIS — K5730 Diverticulosis of large intestine without perforation or abscess without bleeding: Secondary | ICD-10-CM | POA: Diagnosis not present

## 2021-05-24 DIAGNOSIS — Z79899 Other long term (current) drug therapy: Secondary | ICD-10-CM | POA: Insufficient documentation

## 2021-05-24 DIAGNOSIS — I10 Essential (primary) hypertension: Secondary | ICD-10-CM | POA: Insufficient documentation

## 2021-05-24 DIAGNOSIS — R109 Unspecified abdominal pain: Secondary | ICD-10-CM | POA: Diagnosis not present

## 2021-05-24 DIAGNOSIS — R0602 Shortness of breath: Secondary | ICD-10-CM | POA: Insufficient documentation

## 2021-05-24 DIAGNOSIS — R030 Elevated blood-pressure reading, without diagnosis of hypertension: Secondary | ICD-10-CM

## 2021-05-24 LAB — CBC WITH DIFFERENTIAL/PLATELET
Abs Immature Granulocytes: 0.02 10*3/uL (ref 0.00–0.07)
Basophils Absolute: 0.1 10*3/uL (ref 0.0–0.1)
Basophils Relative: 1 %
Eosinophils Absolute: 0.2 10*3/uL (ref 0.0–0.5)
Eosinophils Relative: 2 %
HCT: 39.9 % (ref 36.0–46.0)
Hemoglobin: 12.2 g/dL (ref 12.0–15.0)
Immature Granulocytes: 0 %
Lymphocytes Relative: 20 %
Lymphs Abs: 1.3 10*3/uL (ref 0.7–4.0)
MCH: 29.5 pg (ref 26.0–34.0)
MCHC: 30.6 g/dL (ref 30.0–36.0)
MCV: 96.4 fL (ref 80.0–100.0)
Monocytes Absolute: 0.6 10*3/uL (ref 0.1–1.0)
Monocytes Relative: 9 %
Neutro Abs: 4.4 10*3/uL (ref 1.7–7.7)
Neutrophils Relative %: 68 %
Platelets: 152 10*3/uL (ref 150–400)
RBC: 4.14 MIL/uL (ref 3.87–5.11)
RDW: 13.7 % (ref 11.5–15.5)
WBC: 6.6 10*3/uL (ref 4.0–10.5)
nRBC: 0 % (ref 0.0–0.2)

## 2021-05-24 LAB — COMPREHENSIVE METABOLIC PANEL
ALT: 15 U/L (ref 0–44)
AST: 17 U/L (ref 15–41)
Albumin: 3.4 g/dL — ABNORMAL LOW (ref 3.5–5.0)
Alkaline Phosphatase: 55 U/L (ref 38–126)
Anion gap: 4 — ABNORMAL LOW (ref 5–15)
BUN: 32 mg/dL — ABNORMAL HIGH (ref 8–23)
CO2: 30 mmol/L (ref 22–32)
Calcium: 9.6 mg/dL (ref 8.9–10.3)
Chloride: 105 mmol/L (ref 98–111)
Creatinine, Ser: 1.25 mg/dL — ABNORMAL HIGH (ref 0.44–1.00)
GFR, Estimated: 43 mL/min — ABNORMAL LOW (ref >60)
Glucose, Bld: 116 mg/dL — ABNORMAL HIGH (ref 70–99)
Potassium: 3.9 mmol/L (ref 3.5–5.1)
Sodium: 139 mmol/L (ref 135–145)
Total Bilirubin: 0.4 mg/dL (ref 0.3–1.2)
Total Protein: 6.3 g/dL — ABNORMAL LOW (ref 6.5–8.1)

## 2021-05-24 LAB — TROPONIN I (HIGH SENSITIVITY)
Troponin I (High Sensitivity): 16 ng/L (ref <18)
Troponin I (High Sensitivity): 19 ng/L — ABNORMAL HIGH (ref <18)

## 2021-05-24 NOTE — ED Provider Triage Note (Signed)
Emergency Medicine Provider Triage Evaluation Note  Carolyn [name] , a 86 y.o. female  was evaluated in triage.  Pt complains of blood pressure elevated to 631 systolic earlier today, chest pain, shortness of breath and back pain.  She has been compliant with her home antihypertensives.  Denies any headache or blurry vision.  Review of Systems  Positive: Chest pain, shortness of breath Negative: Headache, blurry vision  Physical Exam  BP (!) 143/69   Pulse (!) 57   Temp 98.4 F (36.9 C) (Oral)   Resp 16   SpO2 97%  Gen:   Awake, no distress   Resp:  Normal effort  MSK:   Moves extremities without difficulty  Other:  Lungs clear bilaterally  Medical Decision Making  Medically screening exam initiated at 7:28 PM.  Appropriate orders placed.  Carolyn [name] was informed that the remainder of the evaluation will be completed by another provider, this initial triage assessment does not replace that evaluation, and the importance of remaining in the ED until their evaluation is complete.  Labs and EKG ordered   Delia Heady, [place] 05/24/21 1929

## 2021-05-24 NOTE — ED Triage Notes (Signed)
Patient reports elevated blood pressure  BP= 224/90 at home yesterday with mild SOB ,intermittent headache and fatigue .

## 2021-05-24 NOTE — ED Provider Notes (Signed)
[facility] EMERGENCY DEPARTMENT Provider Note   CSN: [id number] Arrival date & time: 05/24/21  1843     History  Chief Complaint  Patient presents with   Hypertension    Carolyn [name] is a 86 y.o. female.  Renal artery stenosis, hypertension, carotid stenosis here for evaluation multiple complaints.  Patient states her blood pressures have been up and down over the last week or so.  She also noted some mild frontal aching headache.  No numbness, weakness, difficulty with word finding.  No neck pain, fever.  Does endorse a brief episode of shortness of breath earlier today. No pain, LE swelling. No hx of PE, DVT, PND, Orthopnea.  Also with some right flank pain.  States initially she thought this was due to reaching forward earlier in the week however has had some persistent pain.  No dysuria, hematuria, N/V/D, bloody stool, cough, congestion.  HPI     Home Medications Prior to Admission medications   Medication Sig Start Date End Date Taking? Authorizing Provider  amLODipine (NORVASC) 10 MG tablet Take 10 mg by mouth daily. 03/19/19   [provider]  dicyclomine (BENTYL) 10 MG capsule Take 10 mg by mouth 4 (four) times daily as needed for spasms.     [provider]  doxycycline (VIBRAMYCIN) 100 MG capsule Take 1 capsule (100 mg total) by mouth 2 (two) times daily. 10/03/20   Domenic Moras, PA-C  fenofibrate (TRICOR) 145 MG tablet Take 145 mg by mouth daily.    [provider]  lisinopril-hydrochlorothiazide (PRINZIDE,ZESTORETIC) 20-12.5 MG tablet Take 1 tablet by mouth daily.    [provider]  Niacinamide-Zn-Cu-Methfo-Se-Cr (NICOTINAMIDE PO) Take 500 mg by mouth daily.    [provider]  sertraline (ZOLOFT) 50 MG tablet Take 50 mg by mouth daily.    [provider]      Allergies    Penicillins    Review of Systems   Review of Systems  Constitutional: Negative.   HENT: Negative.    Gastrointestinal:   Positive for abdominal pain. Negative for abdominal distention, anal bleeding, blood in stool, constipation, diarrhea, nausea, rectal pain and vomiting.  Genitourinary:  Positive for flank pain.  Skin: Negative.   Neurological:  Positive for headaches. Negative for dizziness, tremors, seizures, syncope, speech difficulty, weakness, light-headedness and numbness.  All other systems reviewed and are negative.  Physical Exam Updated Vital Signs BP (!) 184/68   Pulse (!) 54   Temp 98.4 F (36.9 C) (Oral)   Resp 15   SpO2 97%  Physical Exam Vitals and nursing note reviewed.  Constitutional:      General: She is not in acute distress.    Appearance: She is well-developed. She is not ill-appearing, toxic-appearing or diaphoretic.  HENT:     Head: Normocephalic and atraumatic.     Nose: Nose normal.     Mouth/Throat:     Mouth: Mucous membranes are moist.  Eyes:     Pupils: Pupils are equal, round, and reactive to light.  Neck:     Comments: Full ROM, no meningismus  Cardiovascular:     Rate and Rhythm: Normal rate.     Pulses: Normal pulses.          Radial pulses are 2+ on the right side and 2+ on the left side.       Dorsalis pedis pulses are 2+ on the right side and 2+ on the left side.     Heart sounds: Normal  heart sounds.  Pulmonary:     Effort: Pulmonary effort is normal. No respiratory distress.     Breath sounds: Normal breath sounds.     Comments: Clear Bil, speaks in full sentences without difficulty Abdominal:     General: Bowel sounds are normal. There is no distension.     Palpations: Abdomen is soft.     Tenderness: There is abdominal tenderness. There is no right CVA tenderness, left CVA tenderness, guarding or rebound.     Hernia: No hernia is present.     Comments: Soft, Generalized right sided tenderness and right flank tenderness however neg CVA tap bil  Musculoskeletal:        General: Normal range of motion.     Cervical back: Normal range of motion and  neck supple. Tenderness present.     Comments: No bony tenderness. Full ROM, compartments soft. No LE edema, erythema, warmth  Skin:    General: Skin is warm and dry.     Capillary Refill: Capillary refill takes less than 2 seconds.  Neurological:     General: No focal deficit present.     Mental Status: She is alert and oriented to person, place, and time.     Comments: CN 2-12 grossly intact Ambulatory No drift Intact sensation Equal strength  Psychiatric:        Mood and Affect: Mood normal.    ED Results / Procedures / Treatments   Labs (all labs ordered are listed, but only abnormal results are displayed) Labs Reviewed  COMPREHENSIVE METABOLIC PANEL - Abnormal; Notable for the following components:      Result Value   Glucose, Bld 116 (*)    BUN 32 (*)    Creatinine, Ser 1.25 (*)    Total Protein 6.3 (*)    Albumin 3.4 (*)    GFR, Estimated 43 (*)    Anion gap 4 (*)    All other components within normal limits  TROPONIN I (HIGH SENSITIVITY) - Abnormal; Notable for the following components:   Troponin I (High Sensitivity) 19 (*)    All other components within normal limits  CBC WITH DIFFERENTIAL/PLATELET  URINALYSIS, ROUTINE W REFLEX MICROSCOPIC  TROPONIN I (HIGH SENSITIVITY)    EKG EKG Interpretation  Date/Time:  Thursday May 24 2021 19:29:44 EDT Ventricular Rate:  55 PR Interval:  154 QRS Duration: 80 QT Interval:  460 QTC Calculation: 440 R Axis:   95 Text Interpretation: Sinus bradycardia Rightward axis Borderline ECG When compared with ECG of 17-Aug-2020 06:37, PREVIOUS ECG IS PRESENT No significant change since last tracing Confirmed by Isla Pence [phone]) on 05/24/2021 9:24:46 PM  Radiology DG Chest 2 View  Result Date: 05/24/2021 CLINICAL DATA:  Shortness of breath and hypertension EXAM: CHEST - 2 VIEW COMPARISON:  04/07/2018 FINDINGS: Cardiac shadow is stable. Aortic calcifications are seen. Scarring is noted in the bases bilaterally. No acute  infiltrate or sizable effusion is seen. No bony abnormality is noted. IMPRESSION: Chronic scarring without acute abnormality. Electronically Signed   By: Inez Catalina M.D.   On: 05/24/2021 20:01    Procedures Procedures    Medications Ordered in ED Medications - No data to display  ED Course/ Medical Decision Making/ A&P    86 year old hx of renal artery stenosis, carotid stenosis, Htn here for evaluation multiple complaints.  Had noted labile blood pressures over the last few weeks at home.  BP yesterday 789 systolic.  Had some shortness of breath pain.  No lower extremity  swelling.  Also noted right flank pain over the last week she initially thought this was due to moving something however pain has not improved.  Also with headache, has nonfocal neuro exam without deficit.  Labs and imaging personally viewed and interpreted:  CBC without leukocytosis Creatinine 1.25, Glucose 116 Trop 16 UA pending DG chest with chronic scarring, no pneumothorax, cardiomegaly, pulm edema, infiltrates EKG without ischemic changes  Care transferred to [facility], [place] who will FU on remaining labs, imaging and determine dispo.   Patient seen and evaluated by attending, agrees with above treatment, plan and disposition                            Medical Decision Making Amount and/or Complexity of Data Reviewed External Data Reviewed: labs, radiology, ECG and notes. Labs: ordered. Decision-making details documented in ED Course. Radiology: ordered and independent interpretation performed. Decision-making details documented in ED Course. ECG/medicine tests: ordered and independent interpretation performed. Decision-making details documented in ED Course.  Risk OTC drugs. Prescription drug management. Parenteral controlled substances. Decision regarding hospitalization. Diagnosis or treatment significantly limited by social determinants of health.         Final Clinical Impression(s) /  ED Diagnoses Final diagnoses:  Elevated blood pressure reading  Flank pain  Acute nonintractable headache, unspecified headache type    Rx / DC Orders ED Discharge Orders     None         Isaish Alemu A, PA-C 05/24/21 2331    Isla Pence, MD 05/25/21 1503

## 2021-05-25 ENCOUNTER — Emergency Department (HOSPITAL_COMMUNITY): Payer: Medicare HMO

## 2021-05-25 DIAGNOSIS — K5730 Diverticulosis of large intestine without perforation or abscess without bleeding: Secondary | ICD-10-CM | POA: Diagnosis not present

## 2021-05-25 DIAGNOSIS — R519 Headache, unspecified: Secondary | ICD-10-CM | POA: Diagnosis not present

## 2021-05-25 DIAGNOSIS — R109 Unspecified abdominal pain: Secondary | ICD-10-CM | POA: Diagnosis not present

## 2021-05-25 DIAGNOSIS — I10 Essential (primary) hypertension: Secondary | ICD-10-CM | POA: Diagnosis not present

## 2021-05-25 LAB — URINALYSIS, ROUTINE W REFLEX MICROSCOPIC
Bacteria, UA: NONE SEEN
Bilirubin Urine: NEGATIVE
Glucose, UA: NEGATIVE mg/dL
Hgb urine dipstick: NEGATIVE
Ketones, ur: NEGATIVE mg/dL
Nitrite: NEGATIVE
Protein, ur: NEGATIVE mg/dL
Specific Gravity, Urine: 1.014 (ref 1.005–1.030)
pH: 7 (ref 5.0–8.0)

## 2021-05-25 MED ORDER — IOHEXOL 300 MG/ML  SOLN
80.0000 mL | Freq: Once | INTRAMUSCULAR | Status: AC | PRN
  Administered 2021-05-25: 80 mL via INTRAVENOUS

## 2021-05-25 NOTE — ED Provider Notes (Signed)
Received at shift change from Carolyn henderly PA-C please see note for full detail  Patient with medical history including renal artery stenosis hypertension carotid stenosis presents with multiple complaints made are a headache as well as right-sided flank pain,  Per previous provider follow-up on CT imaging if unremarkable patient can be discharged home.  Lab work-which I personally reviewed and interpreted CBC unremarkable, CMP shows glucose of 116 BUN of 32 creatinine 1.25 GFR 43 anion gap 4, UA unremarkable, first troponin was 16, second troponin was 19  Imaging-which I personally reviewed and interpreted chest x-ray unremarkable, CT head unremarkable, CT and pelvis unremarkable  EKG-which I personally reviewed and interpreted negative or signs of ischemia  Reassessment  Patient was reassessed, she was resting comfortably, having no complaints, BP slightly elevated but she is having no symptoms i.e. headaches change in vision paresthesia or weakness of upper or lower extremities, updated her on lab work and imaging she has no complaints at this time and she is ready for discharge.  Disposition  Headache-likely tension headache, follow-up with PCP as needed. Side pain-unclear etiology follow-up with PCP/GI for further evaluation. Elevated blood pressure-continue with home medications follow-up with PCP for further management.      Marcello Fennel, PA-C 05/25/21 0117    Palumbo, April, MD 05/25/21 Carolyn [name]

## 2021-05-25 NOTE — ED Notes (Signed)
Pt verbalized understanding of d/c instructions, meds, and followup care. Denies questions. VSS, no distress noted. Steady gait to exit with all belongings.  ?

## 2021-05-25 NOTE — Discharge Instructions (Signed)
Blood pressure-was slightly elevated today, please continue with your home blood pressure medications, recommend follow-up with your PCP for further management. Side pain-imaging was all reassuring, may use over-the-counter pain medication as needed, follow-up with PCP and/or GI. Headache-likely tension headache, recommend over-the-counter pain medications, follow-up with PCP and/or neurology for further management.

## 2021-05-28 DIAGNOSIS — L570 Actinic keratosis: Secondary | ICD-10-CM | POA: Diagnosis not present

## 2021-05-28 DIAGNOSIS — D0439 Carcinoma in situ of skin of other parts of face: Secondary | ICD-10-CM | POA: Diagnosis not present

## 2021-05-28 DIAGNOSIS — R233 Spontaneous ecchymoses: Secondary | ICD-10-CM | POA: Diagnosis not present

## 2021-05-28 DIAGNOSIS — L821 Other seborrheic keratosis: Secondary | ICD-10-CM | POA: Diagnosis not present

## 2021-05-30 DIAGNOSIS — R197 Diarrhea, unspecified: Secondary | ICD-10-CM | POA: Diagnosis not present

## 2021-05-30 DIAGNOSIS — I701 Atherosclerosis of renal artery: Secondary | ICD-10-CM | POA: Diagnosis not present

## 2021-05-30 DIAGNOSIS — R109 Unspecified abdominal pain: Secondary | ICD-10-CM | POA: Diagnosis not present

## 2021-05-30 DIAGNOSIS — D72829 Elevated white blood cell count, unspecified: Secondary | ICD-10-CM | POA: Diagnosis not present

## 2021-05-30 DIAGNOSIS — I10 Essential (primary) hypertension: Secondary | ICD-10-CM | POA: Diagnosis not present

## 2021-05-30 DIAGNOSIS — Z6829 Body mass index (BMI) 29.0-29.9, adult: Secondary | ICD-10-CM | POA: Diagnosis not present

## 2021-05-30 DIAGNOSIS — Z79899 Other long term (current) drug therapy: Secondary | ICD-10-CM | POA: Diagnosis not present

## 2021-05-31 ENCOUNTER — Telehealth: Payer: Self-pay

## 2021-05-31 NOTE — Telephone Encounter (Signed)
Pt called with c/o still having hypertension and was told to call us to schedule f/u. Per MD, pt should see cardiology since [place] did not show any critical stenosis. Pt verbalized understanding and will call us back if she has any further questions/concerns.

## 2021-06-05 ENCOUNTER — Encounter: Payer: Self-pay | Admitting: Cardiology

## 2021-06-05 ENCOUNTER — Encounter: Payer: Self-pay | Admitting: *Deleted

## 2021-06-05 DIAGNOSIS — Z6829 Body mass index (BMI) 29.0-29.9, adult: Secondary | ICD-10-CM | POA: Diagnosis not present

## 2021-06-05 DIAGNOSIS — E538 Deficiency of other specified B group vitamins: Secondary | ICD-10-CM | POA: Diagnosis not present

## 2021-06-05 DIAGNOSIS — I10 Essential (primary) hypertension: Secondary | ICD-10-CM | POA: Diagnosis not present

## 2021-06-06 ENCOUNTER — Encounter: Payer: Self-pay | Admitting: Gastroenterology

## 2021-06-07 ENCOUNTER — Encounter: Payer: Self-pay | Admitting: Cardiology

## 2021-06-07 ENCOUNTER — Ambulatory Visit (INDEPENDENT_AMBULATORY_CARE_PROVIDER_SITE_OTHER): Payer: Medicare HMO

## 2021-06-07 ENCOUNTER — Ambulatory Visit (INDEPENDENT_AMBULATORY_CARE_PROVIDER_SITE_OTHER): Payer: Medicare HMO | Admitting: Cardiology

## 2021-06-07 VITALS — BP 190/70 | HR 63 | Ht 61.0 in | Wt 158.0 lb

## 2021-06-07 DIAGNOSIS — E785 Hyperlipidemia, unspecified: Secondary | ICD-10-CM | POA: Diagnosis not present

## 2021-06-07 DIAGNOSIS — R030 Elevated blood-pressure reading, without diagnosis of hypertension: Secondary | ICD-10-CM | POA: Diagnosis not present

## 2021-06-07 DIAGNOSIS — R0609 Other forms of dyspnea: Secondary | ICD-10-CM | POA: Diagnosis not present

## 2021-06-07 DIAGNOSIS — I701 Atherosclerosis of renal artery: Secondary | ICD-10-CM

## 2021-06-07 DIAGNOSIS — I10 Essential (primary) hypertension: Secondary | ICD-10-CM | POA: Diagnosis not present

## 2021-06-07 NOTE — Patient Instructions (Signed)
Medication Instructions:  Your physician recommends that you continue on your current medications as directed. Please refer to the Current Medication list given to you today.  *If you need a refill on your cardiac medications before your next appointment, please call your pharmacy*   Lab Work: BMP today If you have labs (blood work) drawn today and your tests are completely normal, you will receive your results only by: [place] (if you have MyChart) OR A paper copy in the mail If you have any lab test that is abnormal or we need to change your treatment, we will call you to review the results.   Testing/Procedures: 24 hour BP monitor    Follow-Up: At [facility], you and your health needs are our priority.  As part of our continuing mission to provide you with exceptional heart care, we have created designated Provider Care Teams.  These Care Teams include your primary Cardiologist (physician) and Advanced Practice Providers (APPs -  Physician Assistants and Nurse Practitioners) who all work together to provide you with the care you need, when you need it.  We recommend signing up for the patient portal called "MyChart".  Sign up information is provided on this After Visit Summary.  MyChart is used to connect with patients for Virtual Visits (Telemedicine).  Patients are able to view lab/test results, encounter notes, upcoming appointments, etc.  Non-urgent messages can be sent to your provider as well.   To learn more about what you can do with MyChart, go to [email or web address].    Your next appointment:   3-4 week(s)  The format for your next appointment:   In Person  Provider:   Jenne Campus, MD    Other Instructions NA

## 2021-06-08 ENCOUNTER — Telehealth: Payer: Self-pay

## 2021-06-08 DIAGNOSIS — R32 Unspecified urinary incontinence: Secondary | ICD-10-CM | POA: Diagnosis not present

## 2021-06-08 DIAGNOSIS — E785 Hyperlipidemia, unspecified: Secondary | ICD-10-CM | POA: Diagnosis not present

## 2021-06-08 DIAGNOSIS — R69 Illness, unspecified: Secondary | ICD-10-CM | POA: Diagnosis not present

## 2021-06-08 DIAGNOSIS — I87309 Chronic venous hypertension (idiopathic) without complications of unspecified lower extremity: Secondary | ICD-10-CM | POA: Diagnosis not present

## 2021-06-08 DIAGNOSIS — I10 Essential (primary) hypertension: Secondary | ICD-10-CM | POA: Diagnosis not present

## 2021-06-08 DIAGNOSIS — H269 Unspecified cataract: Secondary | ICD-10-CM | POA: Diagnosis not present

## 2021-06-08 DIAGNOSIS — K219 Gastro-esophageal reflux disease without esophagitis: Secondary | ICD-10-CM | POA: Diagnosis not present

## 2021-06-08 DIAGNOSIS — M545 Low back pain, unspecified: Secondary | ICD-10-CM | POA: Diagnosis not present

## 2021-06-08 DIAGNOSIS — K589 Irritable bowel syndrome without diarrhea: Secondary | ICD-10-CM | POA: Diagnosis not present

## 2021-06-08 DIAGNOSIS — M1990 Unspecified osteoarthritis, unspecified site: Secondary | ICD-10-CM | POA: Diagnosis not present

## 2021-06-08 DIAGNOSIS — Z811 Family history of alcohol abuse and dependence: Secondary | ICD-10-CM | POA: Diagnosis not present

## 2021-06-08 DIAGNOSIS — R269 Unspecified abnormalities of gait and mobility: Secondary | ICD-10-CM | POA: Diagnosis not present

## 2021-06-08 LAB — BASIC METABOLIC PANEL
BUN/Creatinine Ratio: 34 — ABNORMAL HIGH (ref 12–28)
BUN: 45 mg/dL — ABNORMAL HIGH (ref 8–27)
CO2: 22 mmol/L (ref 20–29)
Calcium: 9.6 mg/dL (ref 8.7–10.3)
Chloride: 109 mmol/L — ABNORMAL HIGH (ref 96–106)
Creatinine, Ser: 1.32 mg/dL — ABNORMAL HIGH (ref 0.57–1.00)
Glucose: 117 mg/dL — ABNORMAL HIGH (ref 70–99)
Potassium: 3.9 mmol/L (ref 3.5–5.2)
Sodium: 141 mmol/L (ref 134–144)
eGFR: 40 mL/min/{1.73_m2} — ABNORMAL LOW (ref >59)

## 2021-06-08 NOTE — Telephone Encounter (Signed)
Results reviewed with pt as per Dr. Krasowski's note.  Pt verbalized understanding and had no additional questions. Routed to PCP  
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
## 2021-06-21 DIAGNOSIS — I8311 Varicose veins of right lower extremity with inflammation: Secondary | ICD-10-CM | POA: Diagnosis not present

## 2021-07-02 DIAGNOSIS — Z6829 Body mass index (BMI) 29.0-29.9, adult: Secondary | ICD-10-CM | POA: Diagnosis not present

## 2021-07-02 DIAGNOSIS — R69 Illness, unspecified: Secondary | ICD-10-CM | POA: Diagnosis not present

## 2021-07-02 DIAGNOSIS — I10 Essential (primary) hypertension: Secondary | ICD-10-CM | POA: Diagnosis not present

## 2021-07-02 DIAGNOSIS — E538 Deficiency of other specified B group vitamins: Secondary | ICD-10-CM | POA: Diagnosis not present

## 2021-07-02 DIAGNOSIS — E785 Hyperlipidemia, unspecified: Secondary | ICD-10-CM | POA: Diagnosis not present

## 2021-07-03 DIAGNOSIS — I8311 Varicose veins of right lower extremity with inflammation: Secondary | ICD-10-CM | POA: Diagnosis not present

## 2021-07-03 DIAGNOSIS — I8312 Varicose veins of left lower extremity with inflammation: Secondary | ICD-10-CM | POA: Diagnosis not present

## 2021-07-03 DIAGNOSIS — I83893 Varicose veins of bilateral lower extremities with other complications: Secondary | ICD-10-CM | POA: Diagnosis not present

## 2021-07-05 ENCOUNTER — Telehealth: Payer: Self-pay

## 2021-07-05 DIAGNOSIS — I10 Essential (primary) hypertension: Secondary | ICD-10-CM

## 2021-07-05 NOTE — Telephone Encounter (Signed)
-----   Message from Park Liter, MD sent at 07/04/2021 12:34 PM EDT ----- 24 hours blood pressure monitor clearly showed abnormally elevated blood pressure.  Please do Chem-7 so we can decide about therapy

## 2021-07-05 NOTE — Telephone Encounter (Signed)
Patient notified of results and recommendations and agreed with plan. Lab order on file, patient has an appt tomorrow will stop by the labs than.

## 2021-07-06 ENCOUNTER — Ambulatory Visit (INDEPENDENT_AMBULATORY_CARE_PROVIDER_SITE_OTHER): Payer: Medicare HMO | Admitting: Cardiology

## 2021-07-06 ENCOUNTER — Encounter: Payer: Self-pay | Admitting: Cardiology

## 2021-07-06 VITALS — BP 134/72 | HR 61 | Ht 62.5 in | Wt 156.6 lb

## 2021-07-06 DIAGNOSIS — R0609 Other forms of dyspnea: Secondary | ICD-10-CM | POA: Diagnosis not present

## 2021-07-06 DIAGNOSIS — I10 Essential (primary) hypertension: Secondary | ICD-10-CM

## 2021-07-06 DIAGNOSIS — E785 Hyperlipidemia, unspecified: Secondary | ICD-10-CM | POA: Diagnosis not present

## 2021-07-06 DIAGNOSIS — I701 Atherosclerosis of renal artery: Secondary | ICD-10-CM | POA: Diagnosis not present
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
## 2021-07-06 NOTE — Patient Instructions (Signed)
Medication Instructions:  Your physician recommends that you continue on your current medications as directed. Please refer to the Current Medication list given to you today.  *If you need a refill on your cardiac medications before your next appointment, please call your pharmacy*   Lab Work: None Ordered If you have labs (blood work) drawn today and your tests are completely normal, you will receive your results only by: [place] (if you have MyChart) OR A paper copy in the mail If you have any lab test that is abnormal or we need to change your treatment, we will call you to review the results.   Testing/Procedures: None Ordered   Follow-Up: At [facility], you and your health needs are our priority.  As part of our continuing mission to provide you with exceptional heart care, we have created designated Provider Care Teams.  These Care Teams include your primary Cardiologist (physician) and Advanced Practice Providers (APPs -  Physician Assistants and Nurse Practitioners) who all work together to provide you with the care you need, when you need it.  We recommend signing up for the patient portal called "MyChart".  Sign up information is provided on this After Visit Summary.  MyChart is used to connect with patients for Virtual Visits (Telemedicine).  Patients are able to view lab/test results, encounter notes, upcoming appointments, etc.  Non-urgent messages can be sent to your provider as well.   To learn more about what you can do with MyChart, go to [email or web address].    Your next appointment:   2 month(s)  The format for your next appointment:   In Person  Provider:   Jenne Campus, MD    Other Instructions NA

## 2021-07-16 ENCOUNTER — Ambulatory Visit: Payer: Medicare HMO | Admitting: Gastroenterology

## 2021-07-26 DIAGNOSIS — S22000A Wedge compression fracture of unspecified thoracic vertebra, initial encounter for closed fracture: Secondary | ICD-10-CM | POA: Diagnosis not present

## 2021-07-26 DIAGNOSIS — S32010A Wedge compression fracture of first lumbar vertebra, initial encounter for closed fracture: Secondary | ICD-10-CM | POA: Diagnosis not present

## 2021-07-27 DIAGNOSIS — M545 Low back pain, unspecified: Secondary | ICD-10-CM | POA: Diagnosis not present

## 2021-08-24 ENCOUNTER — Ambulatory Visit: Payer: Medicare HMO | Admitting: Gastroenterology

## 2021-09-03 DIAGNOSIS — L570 Actinic keratosis: Secondary | ICD-10-CM | POA: Diagnosis not present

## 2021-09-03 DIAGNOSIS — L578 Other skin changes due to chronic exposure to nonionizing radiation: Secondary | ICD-10-CM | POA: Diagnosis not present

## 2021-09-03 DIAGNOSIS — L821 Other seborrheic keratosis: Secondary | ICD-10-CM | POA: Diagnosis not present

## 2021-09-06 ENCOUNTER — Ambulatory Visit: Payer: Medicare HMO | Attending: Cardiology | Admitting: Cardiology

## 2021-09-06 ENCOUNTER — Encounter: Payer: Self-pay | Admitting: Cardiology

## 2021-09-06 VITALS — BP 124/62 | HR 56 | Ht 62.5 in | Wt 156.0 lb

## 2021-09-06 DIAGNOSIS — E785 Hyperlipidemia, unspecified: Secondary | ICD-10-CM | POA: Diagnosis not present

## 2021-09-06 DIAGNOSIS — I6521 Occlusion and stenosis of right carotid artery: Secondary | ICD-10-CM | POA: Diagnosis not present

## 2021-09-06 DIAGNOSIS — R0609 Other forms of dyspnea: Secondary | ICD-10-CM | POA: Diagnosis not present

## 2021-09-06 DIAGNOSIS — I10 Essential (primary) hypertension: Secondary | ICD-10-CM

## 2021-09-06 DIAGNOSIS — G4733 Obstructive sleep apnea (adult) (pediatric): Secondary | ICD-10-CM | POA: Diagnosis not present
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
## 2021-09-06 NOTE — Addendum Note (Signed)
Addended by: Jacobo Forest D on: 09/06/2021 03:42 PM   Modules accepted: Orders

## 2021-09-06 NOTE — Patient Instructions (Signed)
Medication Instructions:  Your physician recommends that you continue on your current medications as directed. Please refer to the Current Medication list given to you today.  *If you need a refill on your cardiac medications before your next appointment, please call your pharmacy*   Lab Work: None Ordered If you have labs (blood work) drawn today and your tests are completely normal, you will receive your results only by: [place] (if you have MyChart) OR A paper copy in the mail If you have any lab test that is abnormal or we need to change your treatment, we will call you to review the results.   Testing/Procedures: Your physician has requested that you have a carotid duplex. This test is an ultrasound of the carotid arteries in your neck. It looks at blood flow through these arteries that supply the brain with blood. Allow one hour for this exam. There are no restrictions or special instructions.   Itamar Home Sleep Study- Will sed to insurance and then call to pick up machine.    Follow-Up: At [facility], you and your health needs are our priority.  As part of our continuing mission to provide you with exceptional heart care, we have created designated Provider Care Teams.  These Care Teams include your primary Cardiologist (physician) and Advanced Practice Providers (APPs -  Physician Assistants and Nurse Practitioners) who all work together to provide you with the care you need, when you need it.  We recommend signing up for the patient portal called "MyChart".  Sign up information is provided on this After Visit Summary.  MyChart is used to connect with patients for Virtual Visits (Telemedicine).  Patients are able to view lab/test results, encounter notes, upcoming appointments, etc.  Non-urgent messages can be sent to your provider as well.   To learn more about what you can do with MyChart, go to [email or web address].    Your next appointment:   6 month(s)  The  format for your next appointment:   In Person  Provider:   Jenne Campus, MD    Other Instructions NA

## 2021-09-07 ENCOUNTER — Telehealth: Payer: Self-pay | Admitting: *Deleted

## 2021-09-07 NOTE — Telephone Encounter (Signed)
Lisa Harris notified ok to activate itamar. 

## 2021-09-21 ENCOUNTER — Encounter (HOSPITAL_BASED_OUTPATIENT_CLINIC_OR_DEPARTMENT_OTHER): Payer: Medicare HMO | Admitting: Cardiology

## 2021-09-21 DIAGNOSIS — G4733 Obstructive sleep apnea (adult) (pediatric): Secondary | ICD-10-CM | POA: Diagnosis not present

## 2021-09-23 NOTE — Procedures (Signed)
   Patient Information Study Date: 09/21/21 Patient Name: Carolyn [name] Patient ID: [id number] Birth Date: June 18, 2035 Age: 86 Gender: Female BMI: 28.8 (W=156 lb, H=5' 2'') Referring Physician: Jenne Campus, MD  TEST DESCRIPTION: Home sleep apnea testing was completed using the WatchPat, a Type 1 device, utilizing peripheral arterial tonometry (PAT), chest movement, actigraphy, pulse oximetry, pulse rate, body position and snore.  AHI was calculated with apnea and hypopnea using valid sleep time as the denominator. RDI includes apneas, hypopneas, and RERAs.  The data acquired and the scoring of sleep and all associated events were performed in accordance with the recommended standards and specifications as outlined in the AASM Manual for the Scoring of Sleep and Associated Events 2.2.0 (2015).  FINDINGS: 1.  Severe Obstructive Sleep Apnea with AHI 58.5/hr.  2.  No significant Central Sleep Apnea with pAHIc 6.2/hr. 3.  Oxygen desaturations as low as 64%. 4.  Severe snoring was present. O2 sats were < 88% for 218.9 min. 5.  Total sleep time was 7 hrs and 19 min. 6.  13% of total sleep time was spent in REM sleep.  7.  Normal sleep onset latency at 18 min.  8.  Prolonged REM sleep onset latency at 225 min.  9.  Total awakenings were 8.   DIAGNOSIS:   Severe Obstructive Sleep Apnea (G47.33) Nocturnal Hypoxemia  RECOMMENDATIONS:   1.  Clinical correlation of these findings is necessary.  The decision to treat obstructive sleep apnea (OSA) is usually based on the presence of apnea symptoms or the presence of associated medical conditions such as Hypertension, Congestive Heart Failure, Atrial Fibrillation or Obesity.  The most common symptoms of OSA are snoring, gasping for breath while sleeping, daytime sleepiness and fatigue.   2.  Initiating apnea therapy is recommended given the presence of symptoms and/or associated conditions. Recommend proceeding with one of the following:     a.   Auto-CPAP therapy with a pressure range of 5-20cm H2O.     b.  An oral appliance (OA) that can be obtained from certain dentists with expertise in sleep medicine.  These are primarily of use in non-obese patients with mild and moderate disease.     c.  An ENT consultation which may be useful to look for specific causes of obstruction and possible treatment options.     d.  If patient is intolerant to PAP therapy, consider referral to ENT for evaluation for hypoglossal nerve stimulator.   3.  Close follow-up is necessary to ensure success with CPAP or oral appliance therapy for maximum benefit.  4.  A follow-up oximetry study on CPAP is recommended to assess the adequacy of therapy and determine the need for supplemental oxygen or the potential need for Bi-level therapy.  An arterial blood gas to determine the adequacy of baseline ventilation and oxygenation should also be considered.  5.  Healthy sleep recommendations include:  adequate nightly sleep (normal 7-9 hrs/night), avoidance of caffeine after noon and alcohol near bedtime, and maintaining a sleep environment that is cool, dark and quiet.  6.  Weight loss for overweight patients is recommended.  Even modest amounts of weight loss can significantly improve the severity of sleep apnea.  7.  Snoring recommendations include:  weight loss where appropriate, side sleeping, and avoidance of alcohol before bed.  8.  Operation of motor vehicle should be avoided when sleepy.  Signature: Fransico Him, MD; [facility]; [place], [place] Board of Sleep Medicine Electronically Signed: 09/23/21

## 2021-09-24 ENCOUNTER — Ambulatory Visit: Payer: Medicare HMO | Attending: Cardiology

## 2021-09-24 ENCOUNTER — Telehealth: Payer: Self-pay | Admitting: *Deleted

## 2021-09-24 DIAGNOSIS — G4733 Obstructive sleep apnea (adult) (pediatric): Secondary | ICD-10-CM

## 2021-09-24 DIAGNOSIS — I6521 Occlusion and stenosis of right carotid artery: Secondary | ICD-10-CM | POA: Diagnosis not present

## 2021-09-24 NOTE — Telephone Encounter (Signed)
-----   Message from Sueanne Margarita, MD sent at 09/23/2021  7:42 PM EDT ----- Please let patient know that they have sleep apnea.  Recommend therapeutic CPAP titration ASAP for treatment of patient's sleep disordered breathing.  If unable to perform an in lab titration then initiate ResMed auto CPAP from 4 to 15cm H2O with heated humidity and mask of choice and overnight pulse ox on CPAP.

## 2021-09-24 NOTE — Telephone Encounter (Signed)
Left message to return a call to discuss sleep study results. 

## 2021-09-25 ENCOUNTER — Other Ambulatory Visit: Payer: Self-pay | Admitting: Cardiology

## 2021-09-25 DIAGNOSIS — G4736 Sleep related hypoventilation in conditions classified elsewhere: Secondary | ICD-10-CM

## 2021-09-25 DIAGNOSIS — G4733 Obstructive sleep apnea (adult) (pediatric): Secondary | ICD-10-CM

## 2021-09-25 NOTE — Telephone Encounter (Signed)
Patient returned a call to me and was given sleep study results and recommendations. She agrees to proceed with titration sleep study pending insurance approval.

## 2021-10-26 ENCOUNTER — Ambulatory Visit: Payer: Medicare HMO | Admitting: Gastroenterology

## 2021-11-01 ENCOUNTER — Ambulatory Visit (HOSPITAL_BASED_OUTPATIENT_CLINIC_OR_DEPARTMENT_OTHER): Payer: Medicare HMO | Attending: Cardiology | Admitting: Cardiology

## 2021-11-01 VITALS — Ht 63.0 in | Wt 154.0 lb

## 2021-11-01 DIAGNOSIS — G4733 Obstructive sleep apnea (adult) (pediatric): Secondary | ICD-10-CM | POA: Diagnosis not present

## 2021-11-02 NOTE — Procedures (Signed)
    Patient Name: Carolyn [name], Carolyn [name] Date: 11/01/2021 Gender: Female D.O.B: Oct 23, 1935 Age (years): 67 Referring Provider: Fransico Him MD, ABSM Height (inches): 63 Interpreting Physician: Fransico Him MD, ABSM Weight (lbs): 154 RPSGT: Baxter Flattery BMI: 27 MRN: [id number] Neck Size: 15.00  CLINICAL INFORMATION The patient is referred for a CPAP titration to treat sleep apnea.  SLEEP STUDY TECHNIQUE As per the AASM Manual for the Scoring of Sleep and Associated Events v2.3 (April 2016) with a hypopnea requiring 4% desaturations.  The channels recorded and monitored were frontal, central and occipital EEG, electrooculogram (EOG), submentalis EMG (chin), nasal and oral airflow, thoracic and abdominal wall motion, anterior tibialis EMG, snore microphone, electrocardiogram, and pulse oximetry. Continuous positive airway pressure (CPAP) was initiated at the beginning of the study and titrated to treat sleep-disordered breathing.  MEDICATIONS Medications self-administered by patient taken the night of the study : N/A  TECHNICIAN COMMENTS Comments added by technician: Patient tolerated the mask. Comments added by scorer: N/A  RESPIRATORY PARAMETERS Optimal PAP Pressure (cm): 9  AHI at Optimal Pressure (/hr):1.5 Overall Minimal O2 (%):79.0  Supine % at Optimal Pressure (%):100 Minimal O2 at Optimal Pressure (%): 86.0   SLEEP ARCHITECTURE The study was initiated at 10:50:53 PM and ended at 4:48:24 AM.  Sleep onset time was 22.1 minutes and the sleep efficiency was 87.7%. The total sleep time was 313.5 minutes.  The patient spent 2.7% of the night in stage N1 sleep, 90.1% in stage N2 sleep, 0.0% in stage N3 and 7.2% in REM.Stage REM latency was 201.0 minutes  Wake after sleep onset was 21.9. Alpha intrusion was absent. Supine sleep was 47.40%.  CARDIAC DATA The 2 lead EKG demonstrated sinus rhythm. The mean heart rate was 57.1 beats per minute. Other EKG findings include:  None.  LEG MOVEMENT DATA The total Periodic Limb Movements of Sleep (PLMS) were 0. The PLMS index was 0.0. A PLMS index of <15 is considered normal in adults.  IMPRESSIONS - The optimal PAP pressure was 9 cm of water. - Severe oxygen desaturations were observed during this titration (min O2 = 79.0%). - No snoring was audible during this study. - No cardiac abnormalities were observed during this study. - Clinically significant periodic limb movements were not noted during this study. Arousals associated with PLMs were rare.  DIAGNOSIS - Obstructive Sleep Apnea (G47.33)  RECOMMENDATIONS - Trial of ResMed CPAP therapy on 9 cm H2O with a Small size Resmed Nasal Pillow AirFit P30 mask and heated humidification. - Avoid alcohol, sedatives and other CNS depressants that may worsen sleep apnea and disrupt normal sleep architecture. - Sleep hygiene should be reviewed to assess factors that may improve sleep quality. - Weight management and regular exercise should be initiated or continued. - Return to Sleep Center for re-evaluation after 6 weeks of therapy  [Electronically signed] 11/02/2021 02:09 PM  Fransico Him MD, ABSM Diplomate, American Board of Sleep Medicine

## 2021-11-12 DIAGNOSIS — I10 Essential (primary) hypertension: Secondary | ICD-10-CM | POA: Diagnosis not present

## 2021-11-12 DIAGNOSIS — R69 Illness, unspecified: Secondary | ICD-10-CM | POA: Diagnosis not present

## 2021-11-12 DIAGNOSIS — E785 Hyperlipidemia, unspecified: Secondary | ICD-10-CM | POA: Diagnosis not present

## 2021-11-12 DIAGNOSIS — E559 Vitamin D deficiency, unspecified: Secondary | ICD-10-CM | POA: Diagnosis not present

## 2021-11-12 DIAGNOSIS — E538 Deficiency of other specified B group vitamins: Secondary | ICD-10-CM | POA: Diagnosis not present

## 2021-11-12 DIAGNOSIS — Z6829 Body mass index (BMI) 29.0-29.9, adult: Secondary | ICD-10-CM | POA: Diagnosis not present

## 2021-11-16 ENCOUNTER — Encounter: Payer: Self-pay | Admitting: Gastroenterology

## 2021-11-16 ENCOUNTER — Ambulatory Visit (INDEPENDENT_AMBULATORY_CARE_PROVIDER_SITE_OTHER): Payer: Medicare HMO | Admitting: Gastroenterology

## 2021-11-16 VITALS — BP 154/90 | HR 55 | Ht 62.0 in | Wt 158.2 lb

## 2021-11-16 DIAGNOSIS — R1030 Lower abdominal pain, unspecified: Secondary | ICD-10-CM | POA: Diagnosis not present

## 2021-11-16 DIAGNOSIS — K219 Gastro-esophageal reflux disease without esophagitis: Secondary | ICD-10-CM | POA: Diagnosis not present

## 2021-11-16 MED ORDER — OMEPRAZOLE 20 MG PO CPDR: 20.0000 mg | DELAYED_RELEASE_CAPSULE | Freq: Every day | ORAL | 0 refills | Status: DC

## 2021-11-16 MED ORDER — CLENPIQ 10-3.5-12 MG-GM -GM/175ML PO SOLN: 1.0000 | Freq: Once | ORAL | 0 refills | Status: DC
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
## 2021-11-16 NOTE — Patient Instructions (Addendum)
We have sent the following medications to your pharmacy for you to pick up at your convenience: Omeprazole  If you are age 84 or older, your body mass index should be between 23-30. Your Body mass index is 28.94 kg/m. If this is out of the aforementioned range listed, please consider follow up with your Primary Care Provider.  If you are age 22 or younger, your body mass index should be between 19-25. Your Body mass index is 28.94 kg/m. If this is out of the aformentioned range listed, please consider follow up with your Primary Care Provider.   __________________________________________________________  The [facility] providers would like to encourage you to use [facility] to communicate with providers for non-urgent requests or questions.  Due to long hold times on the telephone, sending your provider a message by [facility] may be a faster and more efficient way to get a response.  Please allow 48 business hours for a response.  Please remember that this is for non-urgent requests.    You have been scheduled for an endoscopy and colonoscopy. Please follow the written instructions given to you at your visit today. Please pick up your prep supplies at the pharmacy within the next 1-3 days. If you use inhalers (even only as needed), please bring them with you on the day of your procedure.

## 2021-11-20 ENCOUNTER — Telehealth: Payer: Self-pay | Admitting: *Deleted

## 2021-11-20 NOTE — Telephone Encounter (Signed)
-----   Message from Lauralee Evener, [place] sent at 11/02/2021  3:20 PM EDT -----  ----- Message ----- From: Sueanne Margarita, MD Sent: 11/02/2021   2:11 PM EDT To: [facility] Sleep Studies  Please let patient know that they had a successful PAP titration and let DME know that orders are in EPIC.  Please set up 6 week OV with me.

## 2021-11-20 NOTE — Telephone Encounter (Signed)
The patient has been notified of the result and verbalized understanding.  All questions (if any) were answered. Carolyn [name], [place] 11/20/2021 6:41 PM    Patient has declined to get a cpap stating she is 86 years old and she can't afford it.

## 2022-01-17 ENCOUNTER — Encounter: Payer: Self-pay | Admitting: Gastroenterology

## 2022-01-24 ENCOUNTER — Encounter: Payer: Self-pay | Admitting: Gastroenterology

## 2022-01-24 ENCOUNTER — Ambulatory Visit (AMBULATORY_SURGERY_CENTER): Payer: Medicare HMO | Admitting: Gastroenterology

## 2022-01-24 VITALS — BP 156/67 | HR 63 | Temp 97.3°F | Resp 17 | Ht 62.0 in | Wt 158.0 lb

## 2022-01-24 DIAGNOSIS — I10 Essential (primary) hypertension: Secondary | ICD-10-CM | POA: Diagnosis not present

## 2022-01-24 DIAGNOSIS — K5730 Diverticulosis of large intestine without perforation or abscess without bleeding: Secondary | ICD-10-CM | POA: Diagnosis not present

## 2022-01-24 DIAGNOSIS — K229 Disease of esophagus, unspecified: Secondary | ICD-10-CM | POA: Diagnosis not present

## 2022-01-24 DIAGNOSIS — K219 Gastro-esophageal reflux disease without esophagitis: Secondary | ICD-10-CM

## 2022-01-24 DIAGNOSIS — D124 Benign neoplasm of descending colon: Secondary | ICD-10-CM

## 2022-01-24 DIAGNOSIS — R1030 Lower abdominal pain, unspecified: Secondary | ICD-10-CM

## 2022-01-24 DIAGNOSIS — K2970 Gastritis, unspecified, without bleeding: Secondary | ICD-10-CM

## 2022-01-24 DIAGNOSIS — K2950 Unspecified chronic gastritis without bleeding: Secondary | ICD-10-CM | POA: Diagnosis not present

## 2022-01-24 MED ORDER — OMEPRAZOLE 20 MG PO CPDR: 20.0000 mg | DELAYED_RELEASE_CAPSULE | Freq: Every day | ORAL | 3 refills | Status: AC

## 2022-01-24 MED ORDER — SODIUM CHLORIDE 0.9 % IV SOLN: 500.0000 mL | Freq: Once | INTRAVENOUS | Status: DC

## 2022-01-24 NOTE — Op Note (Signed)
[facility] Patient Name: Carolyn [name] Procedure Date: 01/24/2022 1:28 PM MRN: [id number] Endoscopist: Jackquline Denmark , MD, [id number] Age: 87 Referring MD:  Date of Birth: 1935-08-18 Gender: Female Account #: [id number] Procedure:                Colonoscopy Indications:              Clinically significant diarrhea of unexplained                            origin. Lower abdo pain Medicines:                Monitored Anesthesia Care Procedure:                Pre-Anesthesia Assessment:                           - Prior to the procedure, a History and Physical                            was performed, and patient medications and                            allergies were reviewed. The patient's tolerance of                            previous anesthesia was also reviewed. The risks                            and benefits of the procedure and the sedation                            options and risks were discussed with the patient.                            All questions were answered, and informed consent                            was obtained. Prior Anticoagulants: The patient has                            taken no anticoagulant or antiplatelet agents. ASA                            Grade Assessment: III - A patient with severe                            systemic disease. After reviewing the risks and                            benefits, the patient was deemed in satisfactory                            condition to undergo the procedure.  After obtaining informed consent, the colonoscope                            was passed under direct vision. Throughout the                            procedure, the patient's blood pressure, pulse, and                            oxygen saturations were monitored continuously. The                            Olympus Scope [id number] was introduced through the                            anus and advanced to the the  cecum, identified by                            appendiceal orifice and ileocecal valve. The TI was                            just intubated. The colonoscopy was performed                            without difficulty. The patient tolerated the                            procedure well. The quality of the bowel                            preparation was good. The ileocecal valve,                            appendiceal orifice, and rectum were photographed. Scope In: 1:44:39 PM Scope Out: 2:05:20 PM Scope Withdrawal Time: 0 hours 17 minutes 36 seconds  Total Procedure Duration: 0 hours 20 minutes 41 seconds  Findings:                 Two sessile polyps were found in the proximal                            descending colon and mid descending colon. The                            polyps were 4 to 8 mm in size. These polyps were                            removed with a cold snare. Resection and retrieval                            were complete.                           The colon (entire examined portion) appeared  normal. Biopsies for histology were taken with a                            cold forceps from the entire colon for evaluation                            of microscopic colitis.                           Multiple medium-mouthed diverticula were found in                            the sigmoid colon, descending colon, transverse                            colon and ascending colon. Diverticulosis was most                            prominent in the sigmoid colon giving it a " [place]                            cheese appearance" with muscular hypertrophy. Few                            diverticula had stool impacted. There was no                            endoscopic evidence of diverticulitis.                           Non-bleeding internal hemorrhoids were found during                            retroflexion. The hemorrhoids were small and Grade                             I (internal hemorrhoids that do not prolapse).                           The exam was otherwise without abnormality on                            direct and retroflexion views. TI was just                            intubated and was normal. Complications:            No immediate complications. Estimated Blood Loss:     Estimated blood loss: none. Impression:               - Two 4 to 8 mm polyps in the proximal descending                            colon and in the mid descending colon, removed with  a cold snare. Resected and retrieved.                           - Moderate to severe pancolonic diverticulosis                            predominantly in the sigmoid colon.                           - Non-bleeding internal hemorrhoids.                           - The examination was otherwise normal on direct                            and retroflexion views. Recommendation:           - Patient has a contact number available for                            emergencies. The signs and symptoms of potential                            delayed complications were discussed with the                            patient. Return to normal activities tomorrow.                            Written discharge instructions were provided to the                            patient.                           - High fiber diet.                           - Continue present medications.                           - Await pathology results.                           - Repeat colonoscopy is not recommended to evaluate                            the response to therapy.                           - The findings and recommendations were discussed                            with the patient's family.                           - Return to GI office in 6 months. Earlier, if  still with problems. Jackquline Denmark, MD 01/24/2022 2:12:00 PM This report has been  signed electronically.

## 2022-01-24 NOTE — Op Note (Signed)
[facility] Patient Name: Carolyn [name] Procedure Date: 01/24/2022 1:29 PM MRN: [id number] Endoscopist: Jackquline Denmark , MD, [id number] Age: 87 Referring MD:  Date of Birth: 1935/08/25 Gender: Female Account #: [id number] Procedure:                Upper GI endoscopy Indications:              Epigastric abdominal pain with GERD Medicines:                Monitored Anesthesia Care Procedure:                Pre-Anesthesia Assessment:                           - Prior to the procedure, a History and Physical                            was performed, and patient medications and                            allergies were reviewed. The patient's tolerance of                            previous anesthesia was also reviewed. The risks                            and benefits of the procedure and the sedation                            options and risks were discussed with the patient.                            All questions were answered, and informed consent                            was obtained. Prior Anticoagulants: The patient has                            taken no anticoagulant or antiplatelet agents. ASA                            Grade Assessment: III - A patient with severe                            systemic disease. After reviewing the risks and                            benefits, the patient was deemed in satisfactory                            condition to undergo the procedure.                           After obtaining informed consent, the endoscope was  passed under direct vision. Throughout the                            procedure, the patient's blood pressure, pulse, and                            oxygen saturations were monitored continuously. The                            GIF [id number] was introduced through the                            mouth, and advanced to the second part of duodenum.                            The upper GI  endoscopy was accomplished without                            difficulty. The patient tolerated the procedure                            well. Scope In: Scope Out: Findings:                 LA Grade A (one or more mucosal breaks less than 5                            mm, not extending between tops of 2 mucosal folds)                            esophagitis with no bleeding was found 34 to 35 cm                            from the incisors. Biopsies were taken with a cold                            forceps for histology.                           Localized mild inflammation characterized by                            erythema and friability was found in the gastric                            antrum. Biopsies were taken with a cold forceps for                            histology.                           The examined duodenum was normal. Biopsies for  histology were taken with a cold forceps for                            evaluation of celiac disease. Complications:            No immediate complications. Estimated Blood Loss:     Estimated blood loss: none. Impression:               - LA Grade A reflux esophagitis with no bleeding.                            Biopsied.                           - Gastritis. Biopsied.                           - Normal examined duodenum. Biopsied. Recommendation:           - Patient has a contact number available for                            emergencies. The signs and symptoms of potential                            delayed complications were discussed with the                            patient. Return to normal activities tomorrow.                            Written discharge instructions were provided to the                            patient.                           - Resume previous diet.                           - Increase omeprazole to 20 mg p.o. twice daily x 4                            weeks, then omeprazole 20 mg  p.o. daily. #90.                           - Await pathology results.                           - The findings and recommendations were discussed                            with the patient's family. Jackquline Denmark, MD 01/24/2022 1:42:44 PM This report has been signed electronically.

## 2022-01-24 NOTE — Progress Notes (Signed)
Vitals-DT  Pt's states no medical or surgical changes since previsit or office visit.  

## 2022-01-24 NOTE — Patient Instructions (Addendum)
Handouts provided on gastritis, esophagitis, polyps, diverticulosis, hemorrhoids and high fiber diet.  Recommend high fiber diet (see handout).  Continue present medications.   Increase Omeprazole to '20mg'$  by mouth twice daily x 4 weeks, then Omeprazole '20mg'$  by mouth once daily.   Resume previous diet.  Await pathology results.  Repeat colonoscopy is not recommended to evaluate the response to therapy.  Return to GI office in 6 months. Earlier, if still with problems. Please call to schedule this appointment.   YOU HAD AN ENDOSCOPIC PROCEDURE TODAY AT [facility]:   Refer to the procedure report that was given to you for any specific questions about what was found during the examination.  If the procedure report does not answer your questions, please call your gastroenterologist to clarify.  If you requested that your care partner not be given the details of your procedure findings, then the procedure report has been included in a sealed envelope for you to review at your convenience later.  YOU SHOULD EXPECT: Some feelings of bloating in the abdomen. Passage of more gas than usual.  Walking can help get rid of the air that was put into your GI tract during the procedure and reduce the bloating. If you had a lower endoscopy (such as a colonoscopy or flexible sigmoidoscopy) you may notice spotting of blood in your stool or on the toilet paper. If you underwent a bowel prep for your procedure, you may not have a normal bowel movement for a few days.  Please Note:  You might notice some irritation and congestion in your nose or some drainage.  This is from the oxygen used during your procedure.  There is no need for concern and it should clear up in a day or so.  SYMPTOMS TO REPORT IMMEDIATELY:  Following lower endoscopy (colonoscopy or flexible sigmoidoscopy):  Excessive amounts of blood in the stool  Significant tenderness or worsening of abdominal pains  Swelling of the abdomen  that is new, acute  Fever of 100F or higher  Following upper endoscopy (EGD)  Vomiting of blood or coffee ground material  New chest pain or pain under the shoulder blades  Painful or persistently difficult swallowing  New shortness of breath  Fever of 100F or higher  Black, tarry-looking stools  For urgent or emergent issues, a gastroenterologist can be reached at any hour by calling [phone]. Do not use MyChart messaging for urgent concerns.    DIET:  We do recommend a small meal at first, but then you may proceed to your regular diet.  Drink plenty of fluids but you should avoid alcoholic beverages for 24 hours.  ACTIVITY:  You should plan to take it easy for the rest of today and you should NOT DRIVE or use heavy machinery until tomorrow (because of the sedation medicines used during the test).    FOLLOW UP: Our staff will call the number listed on your records the next business day following your procedure.  We will call around 7:15- 8:00 am to check on you and address any questions or concerns that you may have regarding the information given to you following your procedure. If we do not reach you, we will leave a message.     If any biopsies were taken you will be contacted by phone or by letter within the next 1-3 weeks.  Please call us at [phone] if you have not heard about the biopsies in 3 weeks.    SIGNATURES/CONFIDENTIALITY: You and/or  your care partner have signed paperwork which will be entered into your electronic medical record.  These signatures attest to the fact that that the information above on your After Visit Summary has been reviewed and is understood.  Full responsibility of the confidentiality of this discharge information lies with you and/or your care-partner.

## 2022-01-24 NOTE — Progress Notes (Signed)
Pt with good respiration and palpable exhalation during EGD. Unable to get ETCO2 waveform until scope removed.

## 2022-01-24 NOTE — Progress Notes (Signed)
Pt resting comfortably. VSS. Airway intact. SBAR complete to RN. All questions answered.   
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
## 2022-01-24 NOTE — Progress Notes (Signed)
Called to room to assist during endoscopic procedure.  Patient ID and intended procedure confirmed with present staff. Received instructions for my participation in the procedure from the performing physician.  

## 2022-01-25 ENCOUNTER — Telehealth: Payer: Self-pay | Admitting: *Deleted

## 2022-01-25 NOTE — Telephone Encounter (Signed)
  Follow up Call-    Row Labels 01/24/2022   12:38 PM 01/24/2022   12:31 PM  Call back number   Section Header. No data exists in this row.    Post procedure Call Back phone  #   [phone]   Permission to leave phone message    Yes     Patient questions:  Do you have a fever, pain , or abdominal swelling? No. Pain Score  0 *  Have you tolerated food without any problems? Yes.    Have you been able to return to your normal activities? Yes.    Do you have any questions about your discharge instructions: Diet   No. Medications  No. Follow up visit  No.  Do you have questions or concerns about your Care? No.  Actions: * If pain score is 4 or above: No action needed, pain <4.

## 2022-02-04 ENCOUNTER — Encounter: Payer: Self-pay | Admitting: Gastroenterology

## 2022-02-13 DIAGNOSIS — E559 Vitamin D deficiency, unspecified: Secondary | ICD-10-CM | POA: Diagnosis not present

## 2022-02-13 DIAGNOSIS — Z6828 Body mass index (BMI) 28.0-28.9, adult: Secondary | ICD-10-CM | POA: Diagnosis not present

## 2022-02-13 DIAGNOSIS — E538 Deficiency of other specified B group vitamins: Secondary | ICD-10-CM | POA: Diagnosis not present

## 2022-02-13 DIAGNOSIS — I739 Peripheral vascular disease, unspecified: Secondary | ICD-10-CM | POA: Diagnosis not present

## 2022-02-13 DIAGNOSIS — E785 Hyperlipidemia, unspecified: Secondary | ICD-10-CM | POA: Diagnosis not present

## 2022-02-13 DIAGNOSIS — I10 Essential (primary) hypertension: Secondary | ICD-10-CM | POA: Diagnosis not present

## 2022-02-13 DIAGNOSIS — R69 Illness, unspecified: Secondary | ICD-10-CM | POA: Diagnosis not present

## 2022-02-13 DIAGNOSIS — I701 Atherosclerosis of renal artery: Secondary | ICD-10-CM | POA: Diagnosis not present

## 2022-02-18 DIAGNOSIS — R69 Illness, unspecified: Secondary | ICD-10-CM | POA: Diagnosis not present

## 2022-02-18 DIAGNOSIS — K219 Gastro-esophageal reflux disease without esophagitis: Secondary | ICD-10-CM | POA: Diagnosis not present

## 2022-02-18 DIAGNOSIS — M5430 Sciatica, unspecified side: Secondary | ICD-10-CM | POA: Diagnosis not present

## 2022-02-18 DIAGNOSIS — I951 Orthostatic hypotension: Secondary | ICD-10-CM | POA: Diagnosis not present

## 2022-02-18 DIAGNOSIS — N1832 Chronic kidney disease, stage 3b: Secondary | ICD-10-CM | POA: Diagnosis not present

## 2022-02-18 DIAGNOSIS — L309 Dermatitis, unspecified: Secondary | ICD-10-CM | POA: Diagnosis not present

## 2022-02-18 DIAGNOSIS — E785 Hyperlipidemia, unspecified: Secondary | ICD-10-CM | POA: Diagnosis not present

## 2022-02-18 DIAGNOSIS — R32 Unspecified urinary incontinence: Secondary | ICD-10-CM | POA: Diagnosis not present

## 2022-02-18 DIAGNOSIS — E669 Obesity, unspecified: Secondary | ICD-10-CM | POA: Diagnosis not present

## 2022-02-18 DIAGNOSIS — Z008 Encounter for other general examination: Secondary | ICD-10-CM | POA: Diagnosis not present

## 2022-02-18 DIAGNOSIS — Z8249 Family history of ischemic heart disease and other diseases of the circulatory system: Secondary | ICD-10-CM | POA: Diagnosis not present

## 2022-02-18 DIAGNOSIS — I129 Hypertensive chronic kidney disease with stage 1 through stage 4 chronic kidney disease, or unspecified chronic kidney disease: Secondary | ICD-10-CM | POA: Diagnosis not present

## 2022-02-18 DIAGNOSIS — M1990 Unspecified osteoarthritis, unspecified site: Secondary | ICD-10-CM | POA: Diagnosis not present

## 2022-02-20 DIAGNOSIS — Z1231 Encounter for screening mammogram for malignant neoplasm of breast: Secondary | ICD-10-CM | POA: Diagnosis not present

## 2022-03-04 DIAGNOSIS — L578 Other skin changes due to chronic exposure to nonionizing radiation: Secondary | ICD-10-CM | POA: Diagnosis not present

## 2022-03-04 DIAGNOSIS — L821 Other seborrheic keratosis: Secondary | ICD-10-CM | POA: Diagnosis not present

## 2022-03-04 DIAGNOSIS — C44722 Squamous cell carcinoma of skin of right lower limb, including hip: Secondary | ICD-10-CM | POA: Diagnosis not present

## 2022-03-04 DIAGNOSIS — D485 Neoplasm of uncertain behavior of skin: Secondary | ICD-10-CM | POA: Diagnosis not present

## 2022-03-04 DIAGNOSIS — L820 Inflamed seborrheic keratosis: Secondary | ICD-10-CM | POA: Diagnosis not present

## 2022-03-04 DIAGNOSIS — R233 Spontaneous ecchymoses: Secondary | ICD-10-CM | POA: Diagnosis not present

## 2022-03-11 ENCOUNTER — Encounter: Payer: Self-pay | Admitting: Gastroenterology

## 2022-03-12 DIAGNOSIS — C44722 Squamous cell carcinoma of skin of right lower limb, including hip: Secondary | ICD-10-CM | POA: Diagnosis not present

## 2022-03-14 ENCOUNTER — Ambulatory Visit: Payer: Medicare HMO | Admitting: Cardiology
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
## 2022-04-30 ENCOUNTER — Ambulatory Visit: Payer: Medicare HMO | Attending: Cardiology | Admitting: Cardiology

## 2022-04-30 ENCOUNTER — Telehealth: Payer: Self-pay | Admitting: Cardiology

## 2022-04-30 ENCOUNTER — Encounter: Payer: Self-pay | Admitting: Cardiology

## 2022-04-30 VITALS — BP 130/62 | HR 63 | Ht 62.0 in | Wt 153.0 lb

## 2022-04-30 DIAGNOSIS — E785 Hyperlipidemia, unspecified: Secondary | ICD-10-CM

## 2022-04-30 DIAGNOSIS — G4733 Obstructive sleep apnea (adult) (pediatric): Secondary | ICD-10-CM | POA: Diagnosis not present

## 2022-04-30 DIAGNOSIS — G4736 Sleep related hypoventilation in conditions classified elsewhere: Secondary | ICD-10-CM

## 2022-04-30 DIAGNOSIS — I700 Atherosclerosis of aorta: Secondary | ICD-10-CM | POA: Diagnosis not present

## 2022-04-30 DIAGNOSIS — I701 Atherosclerosis of renal artery: Secondary | ICD-10-CM

## 2022-04-30 DIAGNOSIS — I10 Essential (primary) hypertension: Secondary | ICD-10-CM

## 2022-04-30 DIAGNOSIS — I6521 Occlusion and stenosis of right carotid artery: Secondary | ICD-10-CM | POA: Diagnosis not present

## 2022-04-30 DIAGNOSIS — R0609 Other forms of dyspnea: Secondary | ICD-10-CM

## 2022-04-30 MED ORDER — NEXLIZET 180-10 MG PO TABS: 1.0000 | ORAL_TABLET | Freq: Every day | ORAL | 3 refills | Status: DC

## 2022-04-30 MED ORDER — EZETIMIBE 10 MG PO TABS: 10.0000 mg | ORAL_TABLET | Freq: Every day | ORAL | 3 refills | Status: DC

## 2022-04-30 NOTE — Patient Instructions (Signed)
Medication Instructions:  Your physician has recommended you make the following change in your medication:  Start Ezetimibe 10 mg once daily at night  *If you need a refill on your cardiac medications before your next appointment, please call your pharmacy*   Lab Work: Your physician recommends that you return for lab work in: 8 weeks for a Fasting Lipid and Liver Panel Lab opens at 8am. You DO NOT NEED an appointment. Best time to come is between 8am and 12noon and between 1:30 and 4:30. If you have been asked to fast for your blood work please have nothing to eat or drink after midnight. You may have water.   If you have labs (blood work) drawn today and your tests are completely normal, you will receive your results only by: MyChart Message (if you have MyChart) OR A paper copy in the mail If you have any lab test that is abnormal or we need to change your treatment, we will call you to review the results.   Testing/Procedures: NONE   Follow-Up: At [facility], you and your health needs are our priority.  As part of our continuing mission to provide you with exceptional heart care, we have created designated Provider Care Teams.  These Care Teams include your primary Cardiologist (physician) and Advanced Practice Providers (APPs -  Physician Assistants and Nurse Practitioners) who all work together to provide you with the care you need, when you need it.  We recommend signing up for the patient portal called "MyChart".  Sign up information is provided on this After Visit Summary.  MyChart is used to connect with patients for Virtual Visits (Telemedicine).  Patients are able to view lab/test results, encounter notes, upcoming appointments, etc.  Non-urgent messages can be sent to your provider as well.   To learn more about what you can do with MyChart, go to [email or web address].    Your next appointment:   1 year(s)  Provider:   Gypsy Balsam, MD    Other  Instructions

## 2022-04-30 NOTE — Telephone Encounter (Signed)
Recommendations reviewed with pt as per Wallis Bamberg, NP's note.  Pt verbalized understanding and had no additional questions.

## 2022-05-15 DIAGNOSIS — E559 Vitamin D deficiency, unspecified: Secondary | ICD-10-CM | POA: Diagnosis not present

## 2022-05-15 DIAGNOSIS — E785 Hyperlipidemia, unspecified: Secondary | ICD-10-CM | POA: Diagnosis not present

## 2022-05-15 DIAGNOSIS — F331 Major depressive disorder, recurrent, moderate: Secondary | ICD-10-CM | POA: Diagnosis not present

## 2022-05-15 DIAGNOSIS — E538 Deficiency of other specified B group vitamins: Secondary | ICD-10-CM | POA: Diagnosis not present

## 2022-05-15 DIAGNOSIS — I10 Essential (primary) hypertension: Secondary | ICD-10-CM | POA: Diagnosis not present

## 2022-08-19 DIAGNOSIS — Z9181 History of falling: Secondary | ICD-10-CM | POA: Diagnosis not present

## 2022-08-19 DIAGNOSIS — E785 Hyperlipidemia, unspecified: Secondary | ICD-10-CM | POA: Diagnosis not present

## 2022-08-19 DIAGNOSIS — I10 Essential (primary) hypertension: Secondary | ICD-10-CM | POA: Diagnosis not present

## 2022-08-19 DIAGNOSIS — R413 Other amnesia: Secondary | ICD-10-CM | POA: Diagnosis not present

## 2022-08-19 DIAGNOSIS — E559 Vitamin D deficiency, unspecified: Secondary | ICD-10-CM | POA: Diagnosis not present

## 2022-09-10 ENCOUNTER — Encounter: Payer: Self-pay | Admitting: Gastroenterology

## 2022-09-10 DIAGNOSIS — R32 Unspecified urinary incontinence: Secondary | ICD-10-CM | POA: Diagnosis not present

## 2022-09-10 DIAGNOSIS — Z6828 Body mass index (BMI) 28.0-28.9, adult: Secondary | ICD-10-CM | POA: Diagnosis not present

## 2022-09-10 DIAGNOSIS — R159 Full incontinence of feces: Secondary | ICD-10-CM | POA: Diagnosis not present

## 2022-09-10 DIAGNOSIS — I10 Essential (primary) hypertension: Secondary | ICD-10-CM | POA: Diagnosis not present

## 2022-09-24 DIAGNOSIS — N952 Postmenopausal atrophic vaginitis: Secondary | ICD-10-CM | POA: Diagnosis not present

## 2022-09-24 DIAGNOSIS — R338 Other retention of urine: Secondary | ICD-10-CM | POA: Diagnosis not present

## 2022-09-24 DIAGNOSIS — Z79899 Other long term (current) drug therapy: Secondary | ICD-10-CM | POA: Diagnosis not present

## 2022-09-24 DIAGNOSIS — R32 Unspecified urinary incontinence: Secondary | ICD-10-CM | POA: Diagnosis not present

## 2022-10-24 ENCOUNTER — Encounter: Payer: Self-pay | Admitting: Gastroenterology

## 2022-10-24 ENCOUNTER — Ambulatory Visit (INDEPENDENT_AMBULATORY_CARE_PROVIDER_SITE_OTHER): Payer: Medicare HMO

## 2022-10-24 ENCOUNTER — Ambulatory Visit (INDEPENDENT_AMBULATORY_CARE_PROVIDER_SITE_OTHER): Payer: Medicare HMO | Admitting: Gastroenterology

## 2022-10-24 VITALS — BP 130/70 | HR 68 | Ht 62.0 in | Wt 154.0 lb

## 2022-10-24 DIAGNOSIS — K219 Gastro-esophageal reflux disease without esophagitis: Secondary | ICD-10-CM

## 2022-10-24 DIAGNOSIS — R197 Diarrhea, unspecified: Secondary | ICD-10-CM | POA: Diagnosis not present

## 2022-10-24 DIAGNOSIS — R1030 Lower abdominal pain, unspecified: Secondary | ICD-10-CM

## 2022-10-24 LAB — CBC WITH DIFFERENTIAL/PLATELET
Basophils Absolute: 0.1 10*3/uL (ref 0.0–0.1)
Basophils Relative: 0.9 % (ref 0.0–3.0)
Eosinophils Absolute: 0.1 10*3/uL (ref 0.0–0.7)
Eosinophils Relative: 1.9 % (ref 0.0–5.0)
HCT: 44.4 % (ref 36.0–46.0)
Hemoglobin: 14.1 g/dL (ref 12.0–15.0)
Lymphocytes Relative: 21.8 % (ref 12.0–46.0)
Lymphs Abs: 1.7 10*3/uL (ref 0.7–4.0)
MCHC: 31.7 g/dL (ref 30.0–36.0)
MCV: 91.9 fL (ref 78.0–100.0)
Monocytes Absolute: 0.7 10*3/uL (ref 0.1–1.0)
Monocytes Relative: 9 % (ref 3.0–12.0)
Neutro Abs: 5.1 10*3/uL (ref 1.4–7.7)
Neutrophils Relative %: 66.4 % (ref 43.0–77.0)
Platelets: 146 10*3/uL — ABNORMAL LOW (ref 150.0–400.0)
RBC: 4.83 Mil/uL (ref 3.87–5.11)
RDW: 14.7 % (ref 11.5–15.5)
WBC: 7.7 10*3/uL (ref 4.0–10.5)

## 2022-10-24 LAB — COMPREHENSIVE METABOLIC PANEL
ALT: 14 U/L (ref 0–35)
AST: 20 U/L (ref 0–37)
Albumin: 3.8 g/dL (ref 3.5–5.2)
Alkaline Phosphatase: 56 U/L (ref 39–117)
BUN: 39 mg/dL — ABNORMAL HIGH (ref 6–23)
CO2: 31 meq/L (ref 19–32)
Calcium: 10.4 mg/dL (ref 8.4–10.5)
Chloride: 102 meq/L (ref 96–112)
Creatinine, Ser: 1.2 mg/dL (ref 0.40–1.20)
GFR: 41.43 mL/min — ABNORMAL LOW (ref >60.00)
Glucose, Bld: 80 mg/dL (ref 70–99)
Potassium: 4.4 meq/L (ref 3.5–5.1)
Sodium: 141 meq/L (ref 135–145)
Total Bilirubin: 0.5 mg/dL (ref 0.2–1.2)
Total Protein: 6.9 g/dL (ref 6.0–8.3)

## 2022-10-24 LAB — C-REACTIVE PROTEIN: CRP: <1 mg/dL (ref 0.5–20.0)

## 2022-10-24 LAB — TSH: TSH: 1.84 u[IU]/mL (ref 0.35–5.50)

## 2022-10-24 MED ORDER — CHOLESTYRAMINE 4 G PO PACK: 4.0000 g | PACK | Freq: Every day | ORAL | 6 refills | Status: DC
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
## 2022-10-24 NOTE — Patient Instructions (Addendum)
Your provider has requested that you go to the basement level for lab work before leaving today. Press "B" on the elevator. The lab is located at the first door on the left as you exit the elevator.  You have been scheduled for a CT scan of the abdomen and pelvis at [facility], 1st floor Radiology. You are scheduled on 10/28/22 at 7:30 am. You should arrive 15 minutes prior to your appointment time for registration.    You may take any medications as prescribed with a small amount of water, if necessary. If you take any of the following medications: METFORMIN, GLUCOPHAGE, GLUCOVANCE, AVANDAMET, RIOMET, FORTAMET, ACTOPLUS MET, JANUMET, GLUMETZA or METAGLIP, you MAY be asked to HOLD this medication 48 hours AFTER the exam.   The purpose of you drinking the oral contrast is to aid in the visualization of your intestinal tract. The contrast solution may cause some diarrhea. Depending on your individual set of symptoms, you may also receive an intravenous injection of x-ray contrast/dye. Plan on being at [facility] for 45 minutes or longer, depending on the type of exam you are having performed.   If you have any questions regarding your exam or if you need to reschedule, you may call [facility] at [phone] between the hours of 8:00 am and 5:00 pm, Monday-Friday.   Your provider has ordered "[employer]" stool testing for you. You have received a kit from our office today containing all necessary supplies to complete this test. Please carefully read the stool collection instructions provided in the kit before opening the accompanying materials. In addition, be sure to place the label from the top right corner of the laboratory request sheet onto the "puritan opti-swab" tube that is supplied in the kit. This label should include your full name and date of birth. After completing the test, you should secure the purtian tube into the specimen biohazard bag. The laboratory request  information sheet (including date and time of specimen collection) should be placed into the outside pocket of the specimen biohazard bag and returned to the [place] lab with 2 days of collection.   If the laboratory information sheet specimen date and time are not filled out, the test will NOT be performed.  We have sent the following medications to your pharmacy for you to pick up at your convenience: Cholestryamine 4g every day with apple sauce, 2 hours before or after taking rest of meds.    If your blood pressure at your visit was 140/90 or greater, please contact your primary care physician to follow up on this.  _______________________________________________________  If you are age 53 or older, your body mass index should be between 23-30. Your Body mass index is 28.17 kg/m. If this is out of the aforementioned range listed, please consider follow up with your Primary Care Provider.  If you are age 53 or younger, your body mass index should be between 19-25. Your Body mass index is 28.17 kg/m. If this is out of the aformentioned range listed, please consider follow up with your Primary Care Provider.   ________________________________________________________  The [place] [facility] providers would like to encourage you to use [facility] to communicate with providers for non-urgent requests or questions.  Due to long hold times on the telephone, sending your provider a message by [facility] may be a faster and more efficient way to get a response.  Please allow 48 business hours for a response.  Please remember that this is for non-urgent requests.  _______________________________________________________. Due to recent changes in healthcare laws, you may see the results of your imaging and laboratory studies on MyChart before your provider has had a chance to review them.  We understand that in some cases there may be results that are confusing or concerning to you. Not all laboratory results come back  in the same time frame and the provider may be waiting for multiple results in order to interpret others.  Please give [place] 48 hours in order for your provider to thoroughly review all the results before contacting the office for clarification of your results.   Thank you for entrusting me with your care and choosing [facility].  Dr Chales Abrahams

## 2022-10-25 DIAGNOSIS — M25562 Pain in left knee: Secondary | ICD-10-CM | POA: Diagnosis not present

## 2022-10-27 LAB — CELIAC PANEL 10
Antigliadin Abs, IgA: 28 U — ABNORMAL HIGH (ref 0–19)
Gliadin IgG: 2 U (ref 0–19)
IgA/Immunoglobulin A, Serum: 284 mg/dL (ref 64–422)

## 2022-10-28 ENCOUNTER — Ambulatory Visit (HOSPITAL_COMMUNITY)
Admission: RE | Admit: 2022-10-28 | Discharge: 2022-10-28 | Disposition: A | Discharge status: Home / Self Care | Payer: Medicare HMO | Source: Ambulatory Visit | Attending: Gastroenterology | Admitting: Gastroenterology

## 2022-10-28 DIAGNOSIS — K219 Gastro-esophageal reflux disease without esophagitis: Secondary | ICD-10-CM | POA: Insufficient documentation

## 2022-10-28 DIAGNOSIS — R1030 Lower abdominal pain, unspecified: Secondary | ICD-10-CM | POA: Diagnosis not present

## 2022-10-28 DIAGNOSIS — K5730 Diverticulosis of large intestine without perforation or abscess without bleeding: Secondary | ICD-10-CM | POA: Diagnosis not present

## 2022-10-28 DIAGNOSIS — R197 Diarrhea, unspecified: Secondary | ICD-10-CM | POA: Diagnosis not present

## 2022-10-28 DIAGNOSIS — K7689 Other specified diseases of liver: Secondary | ICD-10-CM | POA: Diagnosis not present

## 2022-10-28 MED ORDER — IOHEXOL 300 MG/ML  SOLN
80.0000 mL | Freq: Once | INTRAMUSCULAR | Status: AC | PRN
  Administered 2022-10-28: 80 mL via INTRAVENOUS

## 2022-10-30 ENCOUNTER — Encounter: Payer: Self-pay | Admitting: Gastroenterology

## 2022-11-05 DIAGNOSIS — N952 Postmenopausal atrophic vaginitis: Secondary | ICD-10-CM | POA: Diagnosis not present

## 2022-11-05 DIAGNOSIS — N390 Urinary tract infection, site not specified: Secondary | ICD-10-CM | POA: Diagnosis not present

## 2022-11-05 DIAGNOSIS — R32 Unspecified urinary incontinence: Secondary | ICD-10-CM | POA: Diagnosis not present

## 2022-11-05 DIAGNOSIS — R338 Other retention of urine: Secondary | ICD-10-CM | POA: Diagnosis not present

## 2022-11-05 DIAGNOSIS — N8111 Cystocele, midline: Secondary | ICD-10-CM | POA: Diagnosis not present

## 2022-11-07 DIAGNOSIS — M25562 Pain in left knee: Secondary | ICD-10-CM | POA: Diagnosis not present

## 2022-11-07 DIAGNOSIS — M1712 Unilateral primary osteoarthritis, left knee: Secondary | ICD-10-CM | POA: Diagnosis not present

## 2022-11-14 DIAGNOSIS — M25562 Pain in left knee: Secondary | ICD-10-CM | POA: Diagnosis not present

## 2022-11-18 DIAGNOSIS — S83232A Complex tear of medial meniscus, current injury, left knee, initial encounter: Secondary | ICD-10-CM | POA: Diagnosis not present

## 2022-11-19 DIAGNOSIS — M1712 Unilateral primary osteoarthritis, left knee: Secondary | ICD-10-CM | POA: Diagnosis not present

## 2022-11-19 DIAGNOSIS — M25562 Pain in left knee: Secondary | ICD-10-CM | POA: Diagnosis not present

## 2022-11-19 DIAGNOSIS — S83242A Other tear of medial meniscus, current injury, left knee, initial encounter: Secondary | ICD-10-CM | POA: Diagnosis not present

## 2022-11-26 ENCOUNTER — Telehealth: Payer: Self-pay

## 2022-11-26 DIAGNOSIS — F331 Major depressive disorder, recurrent, moderate: Secondary | ICD-10-CM | POA: Diagnosis not present

## 2022-11-26 DIAGNOSIS — E559 Vitamin D deficiency, unspecified: Secondary | ICD-10-CM | POA: Diagnosis not present

## 2022-11-26 DIAGNOSIS — I739 Peripheral vascular disease, unspecified: Secondary | ICD-10-CM | POA: Diagnosis not present

## 2022-11-26 DIAGNOSIS — E538 Deficiency of other specified B group vitamins: Secondary | ICD-10-CM | POA: Diagnosis not present

## 2022-11-26 DIAGNOSIS — R0789 Other chest pain: Secondary | ICD-10-CM | POA: Diagnosis not present

## 2022-11-26 DIAGNOSIS — I701 Atherosclerosis of renal artery: Secondary | ICD-10-CM | POA: Diagnosis not present

## 2022-11-26 DIAGNOSIS — E785 Hyperlipidemia, unspecified: Secondary | ICD-10-CM | POA: Diagnosis not present

## 2022-11-26 DIAGNOSIS — I10 Essential (primary) hypertension: Secondary | ICD-10-CM | POA: Diagnosis not present

## 2022-11-26 NOTE — Telephone Encounter (Signed)
Pt's granddaughter called requesting a f/u appt on January 16th when her father is also here for his f/u. Pt is having surgery for torn meniscus and was told she will need vascular clearance. Explained to her that the orthopedic office would need to submit clearance request to [place]. Pt has not had f/u since her AGM in 2022. She is due for carotid duplex. I have scheduled this for January, as they requested on same day as her father's appt here. No further questions/concerns at this time.

## 2022-11-27 ENCOUNTER — Other Ambulatory Visit: Payer: Self-pay

## 2022-11-27 DIAGNOSIS — R197 Diarrhea, unspecified: Secondary | ICD-10-CM

## 2022-11-27 NOTE — Telephone Encounter (Signed)
Spoke with Pt Granddaughter Carolyn [name] was inquiring about the stools sample that they had submitted.  [employer] was called and requested the results. [facility] stated that the stool was collected but they were unable to process the stool due to no paper work was submitted with it. Carolyn [name] was made aware.  A new stool kit was prepared and placed on 2nd floor for Carolyn [name] to pick up. Carolyn [name] was made aware.  Carolyn [name] verbalized understanding with all questions answered.

## 2022-12-03 DIAGNOSIS — N390 Urinary tract infection, site not specified: Secondary | ICD-10-CM | POA: Diagnosis not present

## 2022-12-03 DIAGNOSIS — N8111 Cystocele, midline: Secondary | ICD-10-CM | POA: Diagnosis not present

## 2022-12-03 DIAGNOSIS — N952 Postmenopausal atrophic vaginitis: Secondary | ICD-10-CM | POA: Diagnosis not present

## 2022-12-03 DIAGNOSIS — R338 Other retention of urine: Secondary | ICD-10-CM | POA: Diagnosis not present

## 2022-12-03 DIAGNOSIS — R32 Unspecified urinary incontinence: Secondary | ICD-10-CM | POA: Diagnosis not present

## 2022-12-04 DIAGNOSIS — M1712 Unilateral primary osteoarthritis, left knee: Secondary | ICD-10-CM | POA: Diagnosis not present

## 2022-12-11 DIAGNOSIS — N289 Disorder of kidney and ureter, unspecified: Secondary | ICD-10-CM | POA: Diagnosis not present

## 2022-12-18 DIAGNOSIS — Z1331 Encounter for screening for depression: Secondary | ICD-10-CM | POA: Diagnosis not present

## 2022-12-18 DIAGNOSIS — Z9181 History of falling: Secondary | ICD-10-CM | POA: Diagnosis not present

## 2022-12-18 DIAGNOSIS — Z139 Encounter for screening, unspecified: Secondary | ICD-10-CM | POA: Diagnosis not present

## 2022-12-18 DIAGNOSIS — Z0000 Encounter for general adult medical examination without abnormal findings: Secondary | ICD-10-CM | POA: Diagnosis not present

## 2022-12-19 ENCOUNTER — Ambulatory Visit: Payer: Medicare HMO | Admitting: Gastroenterology

## 2023-01-13 ENCOUNTER — Other Ambulatory Visit: Payer: Self-pay

## 2023-01-13 DIAGNOSIS — I6523 Occlusion and stenosis of bilateral carotid arteries: Secondary | ICD-10-CM

## 2023-01-23 ENCOUNTER — Encounter: Payer: Self-pay | Admitting: Physician Assistant

## 2023-01-23 ENCOUNTER — Ambulatory Visit: Payer: Medicare PPO | Admitting: Physician Assistant

## 2023-01-23 ENCOUNTER — Ambulatory Visit (HOSPITAL_COMMUNITY)
Admission: RE | Admit: 2023-01-23 | Discharge: 2023-01-23 | Disposition: A | Discharge status: Home / Self Care | Payer: Medicare PPO | Source: Ambulatory Visit | Attending: Vascular Surgery | Admitting: Vascular Surgery

## 2023-01-23 VITALS — BP 206/72 | HR 57 | Temp 97.7°F | Resp 20 | Ht 62.0 in | Wt 154.0 lb

## 2023-01-23 DIAGNOSIS — Z9889 Other specified postprocedural states: Secondary | ICD-10-CM | POA: Diagnosis not present

## 2023-01-23 DIAGNOSIS — I6523 Occlusion and stenosis of bilateral carotid arteries: Secondary | ICD-10-CM
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
## 2023-02-20 DIAGNOSIS — C44729 Squamous cell carcinoma of skin of left lower limb, including hip: Secondary | ICD-10-CM | POA: Diagnosis not present

## 2023-03-11 DIAGNOSIS — Z1231 Encounter for screening mammogram for malignant neoplasm of breast: Secondary | ICD-10-CM | POA: Diagnosis not present

## 2023-03-17 DIAGNOSIS — N952 Postmenopausal atrophic vaginitis: Secondary | ICD-10-CM | POA: Diagnosis not present

## 2023-03-17 DIAGNOSIS — N390 Urinary tract infection, site not specified: Secondary | ICD-10-CM | POA: Diagnosis not present

## 2023-03-17 DIAGNOSIS — R32 Unspecified urinary incontinence: Secondary | ICD-10-CM | POA: Diagnosis not present

## 2023-03-17 DIAGNOSIS — N8111 Cystocele, midline: Secondary | ICD-10-CM | POA: Diagnosis not present

## 2023-03-27 DIAGNOSIS — N390 Urinary tract infection, site not specified: Secondary | ICD-10-CM | POA: Diagnosis not present

## 2023-05-21 NOTE — Progress Notes (Signed)
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 coronary arteries in the heart. Fluid around the heart. Signs of weakness or disease in the muscles, valves, and tissues of the heart. Tell a health care provider about: Any allergies you have. This is especially important if you have had a previous allergic reaction to contrast dye. All medicines you are taking, including vitamins, herbs, eye drops, creams, and over-the-counter medicines. Any blood disorders you have. Any surgeries you have had. Any medical conditions you have. Whether you are pregnant or may be pregnant. Any anxiety disorders, chronic pain, or other conditions you have that may increase your stress or prevent you from lying still. What are the risks? Generally, this is a safe procedure. However,  problems may occur, including: Bleeding. Infection. Allergic reactions to medicines or dyes. Damage to other structures or organs. Kidney damage from the contrast dye that is used. Increased risk of cancer from radiation exposure. This risk is low. Talk with your health care provider about: The risks and benefits of testing. How you can receive the lowest dose of radiation. What happens before the procedure? Wear comfortable clothing and remove any jewelry, glasses, dentures, and hearing aids. Follow instructions from your health care provider about eating and drinking. This may include: For 12 hours before the procedure -- avoid caffeine. This includes tea, coffee, soda, energy drinks, and diet pills. Drink plenty of water or other fluids that do not have caffeine in them. Being well hydrated can prevent complications. For 4-6 hours before the procedure -- stop eating and drinking. The contrast dye can cause nausea, but this is less likely if your stomach is empty. Ask your health care  provider about changing or stopping your regular medicines. This is especially important if you are taking diabetes medicines, blood thinners, or medicines to treat problems with erections (erectile dysfunction). What happens during the procedure?  Hair on your chest may need to be removed so that small sticky patches called electrodes can be placed on your chest. These will transmit information that helps to monitor your heart during the procedure. An IV will be inserted into one of your veins. You might be given a medicine to control your heart rate during the procedure. This will help to ensure that good images are obtained. You will be asked to lie on an exam table. This table will slide in and out of the CT machine during the procedure. Contrast dye will be injected into the IV. You might feel warm, or you may get a metallic taste in your mouth. You will be given a medicine called nitroglycerin. This will  relax or dilate the arteries in your heart. The table that you are lying on will move into the CT machine tunnel for the scan. The person running the machine will give you instructions while the scans are being done. You may be asked to: Keep your arms above your head. Hold your breath. Stay very still, even if the table is moving. When the scanning is complete, you will be moved out of the machine. The IV will be removed. The procedure may vary among health care providers and hospitals. What can I expect after the procedure? After your procedure, it is common to have: A metallic taste in your mouth from the contrast dye. A feeling of warmth. A headache from the nitroglycerin. Follow these instructions at home: Take over-the-counter and prescription medicines only as told by your health care provider. If you are told, drink enough fluid to keep your urine pale yellow. This will help to flush the contrast dye out of your body. Most people can return to their normal activities right after the procedure. Ask your health care provider what activities are safe for you. It is up to you to get the results of your procedure. Ask your health care provider, or the department that is doing the procedure, when your results will be ready. Keep all follow-up visits as told by your health care provider. This is important. Contact a health care provider if: You have any symptoms of allergy to the contrast dye. These include: Shortness of breath. Rash or hives. A racing heartbeat. Summary A cardiac CT angiogram is a procedure to look at the heart and the area around the heart. It may be done to help find the cause of chest pains or other symptoms of heart disease. During this procedure, a large X-ray machine, called a CT scanner, takes detailed pictures of the heart and the surrounding area after a contrast dye has been injected into blood vessels in the area. Ask your health care provider about changing  or stopping your regular medicines before the procedure. This is especially important if you are taking diabetes medicines, blood thinners, or medicines to treat erectile dysfunction. If you are told, drink enough fluid to keep your urine pale yellow. This will help to flush the contrast dye out of your body. This information is not intended to replace advice given to you by your health care provider. Make sure you discuss any questions you have with your health care provider. Document Revised: 08/19/2018 Document Reviewed: 08/19/2018 Elsevier Patient Education  2020 [employer].  Signed, Terrance Ferretti, NP  05/23/2023 12:47 PM    [facility]

## 2023-05-23 ENCOUNTER — Telehealth: Payer: Self-pay | Admitting: Cardiology

## 2023-05-23 ENCOUNTER — Encounter: Payer: Self-pay | Admitting: Cardiology

## 2023-05-23 ENCOUNTER — Ambulatory Visit: Attending: Cardiology | Admitting: Cardiology

## 2023-05-23 VITALS — BP 120/60 | HR 62 | Ht 62.0 in | Wt 154.0 lb

## 2023-05-23 DIAGNOSIS — I6523 Occlusion and stenosis of bilateral carotid arteries: Secondary | ICD-10-CM | POA: Diagnosis not present

## 2023-05-23 DIAGNOSIS — R0609 Other forms of dyspnea: Secondary | ICD-10-CM | POA: Diagnosis not present

## 2023-05-23 DIAGNOSIS — R600 Localized edema: Secondary | ICD-10-CM | POA: Diagnosis not present

## 2023-05-23 DIAGNOSIS — I10 Essential (primary) hypertension: Secondary | ICD-10-CM | POA: Diagnosis not present

## 2023-05-23 DIAGNOSIS — R079 Chest pain, unspecified: Secondary | ICD-10-CM

## 2023-05-23 DIAGNOSIS — E782 Mixed hyperlipidemia: Secondary | ICD-10-CM | POA: Diagnosis not present

## 2023-05-23 DIAGNOSIS — G4733 Obstructive sleep apnea (adult) (pediatric): Secondary | ICD-10-CM | POA: Diagnosis not present

## 2023-05-23 MED ORDER — NITROGLYCERIN 0.4 MG SL SUBL: 0.4000 mg | SUBLINGUAL_TABLET | SUBLINGUAL | 6 refills | Status: AC | PRN

## 2023-05-23 MED ORDER — METOPROLOL TARTRATE 50 MG PO TABS: ORAL_TABLET | ORAL | 0 refills | Status: DC

## 2023-05-23 NOTE — Patient Instructions (Addendum)
 Medication Instructions:  Use nitroglycerin 1 tablet placed under the tongue at the first sign of chest pain or an angina attack. 1 tablet may be used every 5 minutes as needed, for up to 15 minutes. Do not take more than 3 tablets in 15 minutes. If pain persist call 911 or go to the nearest ED.  *If you need a refill on your cardiac medications before your next appointment, please call your pharmacy*   Lab Work: Your physician recommends that you have labs done in the office today. Your test included CMP, Pro BNP and Direct LDL.    If you have labs (blood work) drawn today and your tests are completely normal, you will receive your results only by: MyChart Message (if you have MyChart) OR A paper copy in the mail If you have any lab test that is abnormal or we need to change your treatment, we will call you to review the results.   Testing/Procedures:  Echocardiogram An echocardiogram is a test that uses sound waves (ultrasound) to produce images of the heart. Images from an echocardiogram can provide important information about: Heart size and shape. The size and thickness and movement of your heart's walls. Heart muscle function and strength. Heart valve function or if you have stenosis. Stenosis is when the heart valves are too narrow. If blood is flowing backward through the heart valves (regurgitation). A tumor or infectious growth around the heart valves. Areas of heart muscle that are not working well because of poor blood flow or injury from a heart attack. Aneurysm detection. An aneurysm is a weak or damaged part of an artery wall. The wall bulges out from the normal force of blood pumping through the body. Tell a health care provider about: Any allergies you have. All medicines you are taking, including vitamins, herbs, eye drops, creams, and over-the-counter medicines. Any blood disorders you have. Any surgeries you have had. Any medical conditions you have. Whether you  are pregnant or may be pregnant. What are the risks? Generally, this is a safe test. However, problems may occur, including an allergic reaction to dye (contrast) that may be used during the test. What happens before the test? No specific preparation is needed. You may eat and drink normally. What happens during the test?  You will take off your clothes from the waist up and put on a hospital gown. Electrodes or electrocardiogram (ECG)patches may be placed on your chest. The electrodes or patches are then connected to a device that monitors your heart rate and rhythm. You will lie down on a table for an ultrasound exam. A gel will be applied to your chest to help sound waves pass through your skin. A handheld device, called a transducer, will be pressed against your chest and moved over your heart. The transducer produces sound waves that travel to your heart and bounce back (or "echo" back) to the transducer. These sound waves will be captured in real-time and changed into images of your heart that can be viewed on a video monitor. The images will be recorded on a computer and reviewed by your health care provider. You may be asked to change positions or hold your breath for a short time. This makes it easier to get different views or better views of your heart. In some cases, you may receive contrast through an IV in one of your veins. This can improve the quality of the pictures from your heart. The procedure may vary among health care providers  and hospitals. What can I expect after the test? You may return to your normal, everyday life, including diet, activities, and medicines, unless your health care provider tells you not to do that. Follow these instructions at home: It is up to you to get the results of your test. Ask your health care provider, or the department that is doing the test, when your results will be ready. Keep all follow-up visits. This is important. Summary An  echocardiogram is a test that uses sound waves (ultrasound) to produce images of the heart. Images from an echocardiogram can provide important information about the size and shape of your heart, heart muscle function, heart valve function, and other possible heart problems. You do not need to do anything to prepare before this test. You may eat and drink normally. After the echocardiogram is completed, you may return to your normal, everyday life, unless your health care provider tells you not to do that. This information is not intended to replace advice given to you by your health care provider. Make sure you discuss any questions you have with your health care provider. Document Revised: 09/06/2020 Document Reviewed: 08/17/2019 Elsevier Patient Education  2023 Elsevier Inc.       Your cardiac CT will be scheduled at one of the below locations:   [facility] [address] [place], [place] [address] [phone]   At [facility], please arrive at the [facility] Entrance (Entrance C2) of [facility] 30 minutes prior to test start time. You can use the FREE valet parking offered at entrance C (encouraged to control the heart rate for the test)  Proceed to the [facility] Radiology Department (first floor) to check-in and test prep.  All radiology patients and guests should use entrance C2 at [facility], accessed from [facility], even though the hospital's physical address listed is [address]      Please follow these instructions carefully (unless otherwise directed):  Hold all erectile dysfunction medications at least 3 days (72 hrs) prior to test.  On the Night Before the Test: Be sure to Drink plenty of water. Do not consume any caffeinated/decaffeinated beverages or chocolate 12 hours prior to your test. Do not take any antihistamines 12 hours prior to your test.   On the Day of the Test: Drink  plenty of water until 1 hour prior to the test. Do not eat any food 4 hours prior to the test. You may take your regular medications prior to the test.  Take metoprolol  (Lopressor ) two hours prior to test. HOLD Furosemide/Hydrochlorothiazide  morning of the test. FEMALES- please wear underwire-free bra if available, avoid dresses & tight clothing        After the Test: Drink plenty of water. After receiving IV contrast, you may experience a mild flushed feeling. This is normal. On occasion, you may experience a mild rash up to 24 hours after the test. This is not dangerous. If this occurs, you can take Benadryl 25 mg and increase your fluid intake. If you experience trouble breathing, this can be serious. If it is severe call 911 IMMEDIATELY. If it is mild, please call our office. If you take any of these medications: Glipizide/Metformin, Avandament, Glucavance, please do not take 48 hours after completing test unless otherwise instructed.  We will call to schedule your test 2-4 weeks out understanding that some insurance companies will need an authorization prior to the service being performed.  For non-scheduling related questions, please contact the cardiac imaging nurse navigator should you have any questions/concerns: Jinger Mount, Cardiac Imaging Nurse Navigator Chase Copping, Cardiac Imaging Nurse Navigator [facility] Direct Office Dial: [phone]   For scheduling needs, including cancellations and rescheduling, please call [place], [phone].    Follow-Up: At [facility], you and your health needs are our priority.  As part of our continuing mission to provide you with exceptional heart care, we have created designated Provider Care Teams.  These Care Teams include your primary Cardiologist (physician) and Advanced Practice Providers (APPs -  Physician Assistants and Nurse Practitioners) who all work together to provide you with the care you  need, when you need it.  We recommend signing up for the patient portal called "MyChart".  Sign up information is provided on this After Visit Summary.  MyChart is used to connect with patients for Virtual Visits (Telemedicine).  Patients are able to view lab/test results, encounter notes, upcoming appointments, etc.  Non-urgent messages can be sent to your provider as well.   To learn more about what you can do with MyChart, go to [email or web address].    Your next appointment:   6 week follow up    Other Instructions Cardiac CT Angiogram A cardiac CT angiogram is a procedure to look at the heart and the area around the heart. It may be done to help find the cause of chest pains or other symptoms of heart disease. During this procedure, a substance called contrast dye is injected into the blood vessels in the area to be checked. A large X-ray machine, called a CT scanner, then takes detailed pictures of the heart and the surrounding area. The procedure is also sometimes called a coronary CT angiogram, coronary artery scanning, or CTA. A cardiac CT angiogram allows the health care provider to see how well blood is flowing to and from the heart. The health care provider will be able to see if there are any problems, such as: Blockage or narrowing of the coronary arteries in the heart. Fluid around the heart. Signs of weakness or disease in the muscles, valves, and tissues of the heart. Tell a health care provider about: Any allergies you have. This is especially important if you have had a previous allergic reaction to contrast dye. All medicines you are taking, including vitamins, herbs, eye drops, creams, and over-the-counter medicines. Any blood disorders you have. Any surgeries you have had. Any medical conditions you have. Whether you are pregnant or may be pregnant. Any anxiety disorders, chronic pain, or other conditions you have that may increase your stress or prevent you from lying  still. What are the risks? Generally, this is a safe procedure. However, problems may occur, including: Bleeding. Infection. Allergic reactions to medicines or dyes. Damage to other structures or organs. Kidney damage from the contrast dye that is used. Increased risk of cancer from radiation exposure. This risk is low. Talk with your health care provider about: The risks and benefits of testing. How you can receive the lowest dose of radiation. What happens before the procedure? Wear comfortable clothing and remove any jewelry, glasses, dentures, and hearing aids. Follow instructions from your health care provider about eating and drinking. This may include: For 12 hours before the procedure -- avoid caffeine. This includes tea, coffee, soda, energy drinks, and diet pills. Drink plenty of water or other fluids that do not have caffeine in them. Being well hydrated can prevent complications. For 4-6  hours before the procedure -- stop eating and drinking. The contrast dye can cause nausea, but this is less likely if your stomach is empty. Ask your health care provider about changing or stopping your regular medicines. This is especially important if you are taking diabetes medicines, blood thinners, or medicines to treat problems with erections (erectile dysfunction). What happens during the procedure?  Hair on your chest may need to be removed so that small sticky patches called electrodes can be placed on your chest. These will transmit information that helps to monitor your heart during the procedure. An IV will be inserted into one of your veins. You might be given a medicine to control your heart rate during the procedure. This will help to ensure that good images are obtained. You will be asked to lie on an exam table. This table will slide in and out of the CT machine during the procedure. Contrast dye will be injected into the IV. You might feel warm, or you may get a metallic taste in  your mouth. You will be given a medicine called nitroglycerin. This will relax or dilate the arteries in your heart. The table that you are lying on will move into the CT machine tunnel for the scan. The person running the machine will give you instructions while the scans are being done. You may be asked to: Keep your arms above your head. Hold your breath. Stay very still, even if the table is moving. When the scanning is complete, you will be moved out of the machine. The IV will be removed. The procedure may vary among health care providers and hospitals. What can I expect after the procedure? After your procedure, it is common to have: A metallic taste in your mouth from the contrast dye. A feeling of warmth. A headache from the nitroglycerin. Follow these instructions at home: Take over-the-counter and prescription medicines only as told by your health care provider. If you are told, drink enough fluid to keep your urine pale yellow. This will help to flush the contrast dye out of your body. Most people can return to their normal activities right after the procedure. Ask your health care provider what activities are safe for you. It is up to you to get the results of your procedure. Ask your health care provider, or the department that is doing the procedure, when your results will be ready. Keep all follow-up visits as told by your health care provider. This is important. Contact a health care provider if: You have any symptoms of allergy to the contrast dye. These include: Shortness of breath. Rash or hives. A racing heartbeat. Summary A cardiac CT angiogram is a procedure to look at the heart and the area around the heart. It may be done to help find the cause of chest pains or other symptoms of heart disease. During this procedure, a large X-ray machine, called a CT scanner, takes detailed pictures of the heart and the surrounding area after a contrast dye has been injected into  blood vessels in the area. Ask your health care provider about changing or stopping your regular medicines before the procedure. This is especially important if you are taking diabetes medicines, blood thinners, or medicines to treat erectile dysfunction. If you are told, drink enough fluid to keep your urine pale yellow. This will help to flush the contrast dye out of your body. This information is not intended to replace advice given to you by your health care provider. Make  sure you discuss any questions you have with your health care provider. Document Revised: 08/19/2018 Document Reviewed: 08/19/2018 Elsevier Patient Education  2020 [employer].

## 2023-05-23 NOTE — Telephone Encounter (Signed)
 Patient's DPR is now updated to include her granddaughter/kbl 05/23/23

## 2023-05-23 NOTE — Telephone Encounter (Signed)
 Granddaughter Odilia Bennett) wants a call back after patient's visit today to follow-up on next steps.  Granddaughter also wanted to note patient's ankles have been swelling and she was taken off bladder control medication for a week.

## 2023-05-24 LAB — COMPREHENSIVE METABOLIC PANEL WITH GFR
ALT: 15 IU/L (ref 0–32)
AST: 17 IU/L (ref 0–40)
Albumin: 3.9 g/dL (ref 3.7–4.7)
Alkaline Phosphatase: 63 IU/L (ref 44–121)
BUN/Creatinine Ratio: 20 (ref 12–28)
BUN: 41 mg/dL — ABNORMAL HIGH (ref 8–27)
Bilirubin Total: 0.3 mg/dL (ref 0.0–1.2)
CO2: 24 mmol/L (ref 20–29)
Calcium: 9.5 mg/dL (ref 8.7–10.3)
Chloride: 103 mmol/L (ref 96–106)
Creatinine, Ser: 2.08 mg/dL — ABNORMAL HIGH (ref 0.57–1.00)
Globulin, Total: 2.5 g/dL (ref 1.5–4.5)
Glucose: 103 mg/dL — ABNORMAL HIGH (ref 70–99)
Potassium: 4.4 mmol/L (ref 3.5–5.2)
Sodium: 144 mmol/L (ref 134–144)
Total Protein: 6.4 g/dL (ref 6.0–8.5)
eGFR: 23 mL/min/1.73 — ABNORMAL LOW

## 2023-05-24 LAB — LDL CHOLESTEROL, DIRECT: LDL Direct: 74 mg/dL (ref 0–99)

## 2023-05-24 LAB — PRO B NATRIURETIC PEPTIDE: NT-Pro BNP: 665 pg/mL (ref 0–738)

## 2023-05-25 ENCOUNTER — Ambulatory Visit: Payer: Self-pay | Admitting: Cardiology

## 2023-05-25 NOTE — Telephone Encounter (Signed)
 In addition, we need to cancel her coronary CTA due to her kidney function.  Lets do a Lexiscan in the office.

## 2023-05-26 ENCOUNTER — Other Ambulatory Visit: Payer: Self-pay

## 2023-05-26 ENCOUNTER — Ambulatory Visit: Attending: Cardiology

## 2023-05-26 DIAGNOSIS — R0609 Other forms of dyspnea: Secondary | ICD-10-CM | POA: Diagnosis not present

## 2023-05-26 DIAGNOSIS — I083 Combined rheumatic disorders of mitral, aortic and tricuspid valves: Secondary | ICD-10-CM

## 2023-05-26 DIAGNOSIS — I5030 Unspecified diastolic (congestive) heart failure: Secondary | ICD-10-CM

## 2023-05-26 DIAGNOSIS — I517 Cardiomegaly: Secondary | ICD-10-CM | POA: Diagnosis not present

## 2023-05-27 ENCOUNTER — Other Ambulatory Visit: Payer: Self-pay

## 2023-05-27 ENCOUNTER — Telehealth: Payer: Self-pay

## 2023-05-27 DIAGNOSIS — E785 Hyperlipidemia, unspecified: Secondary | ICD-10-CM | POA: Diagnosis not present

## 2023-05-27 DIAGNOSIS — R079 Chest pain, unspecified: Secondary | ICD-10-CM

## 2023-05-27 DIAGNOSIS — E538 Deficiency of other specified B group vitamins: Secondary | ICD-10-CM | POA: Diagnosis not present

## 2023-05-27 DIAGNOSIS — I701 Atherosclerosis of renal artery: Secondary | ICD-10-CM | POA: Diagnosis not present

## 2023-05-27 DIAGNOSIS — I10 Essential (primary) hypertension: Secondary | ICD-10-CM | POA: Diagnosis not present

## 2023-05-27 DIAGNOSIS — I129 Hypertensive chronic kidney disease with stage 1 through stage 4 chronic kidney disease, or unspecified chronic kidney disease: Secondary | ICD-10-CM | POA: Diagnosis not present

## 2023-05-27 DIAGNOSIS — N183 Chronic kidney disease, stage 3 unspecified: Secondary | ICD-10-CM | POA: Diagnosis not present

## 2023-05-27 DIAGNOSIS — Z6828 Body mass index (BMI) 28.0-28.9, adult: Secondary | ICD-10-CM | POA: Diagnosis not present

## 2023-05-27 DIAGNOSIS — F331 Major depressive disorder, recurrent, moderate: Secondary | ICD-10-CM | POA: Diagnosis not present

## 2023-05-27 DIAGNOSIS — I739 Peripheral vascular disease, unspecified: Secondary | ICD-10-CM | POA: Diagnosis not present

## 2023-05-27 DIAGNOSIS — E559 Vitamin D deficiency, unspecified: Secondary | ICD-10-CM | POA: Diagnosis not present

## 2023-05-27 LAB — ECHOCARDIOGRAM COMPLETE
AR max vel: 2.16 cm2
AV Area VTI: 2.65 cm2
AV Area mean vel: 2.47 cm2
AV Mean grad: 7.5 mmHg
AV Peak grad: 16.5 mmHg
Ao pk vel: 2.03 m/s
MV VTI: 2.55 cm2
P 1/2 time: 95 ms
S' Lateral: 3.2 cm

## 2023-05-27 MED ORDER — HYDRALAZINE HCL 10 MG PO TABS: 10.0000 mg | ORAL_TABLET | Freq: Two times a day (BID) | ORAL | 3 refills | Status: DC

## 2023-05-27 NOTE — Telephone Encounter (Signed)
 Called the patient's daughter and informed her that Pattricia Bores, NP wanted to cancel the patient's Cardiac CT due to her worsening kidney function and recommended doing a Lexiscan instead of Cardiac CT. The instructions for the Lexiscan below were reviewed with the patient's grand daughter:   [facility] Nuclear Imaging [address] [place], [place] [address] Phone:  [phone]    Please arrive 15 minutes prior to your appointment time for registration and insurance purposes.  The test will take approximately 3 to 4 hours to complete; you may bring reading material.  If someone comes with you to your appointment, they will need to remain in the main lobby due to limited space in the testing area. **If you are pregnant or breastfeeding, please notify the nuclear lab prior to your appointment**  How to prepare for your Myocardial Perfusion Test: Do not eat or drink 3 hours prior to your test, except you may have water. Do not consume products containing caffeine (regular or decaffeinated) 12 hours prior to your test. (ex: coffee, chocolate, sodas, tea). Do bring a list of your current medications with you.  If not listed below, you may take your medications as normal. Do wear comfortable clothes (no dresses or overalls) and walking shoes, tennis shoes preferred (No heels or open toe shoes are allowed). Do NOT wear cologne, perfume, aftershave, or lotions (deodorant is allowed). If these instructions are not followed, your test will have to be rescheduled.  Please report to [address] for your test.  If you have questions or concerns about your appointment, you can call the [facility] Lab at [phone].  If you cannot keep your appointment, please provide 24 hours notification to the Nuclear Lab, to avoid a possible $50 charge to your account.  The patient's grand daughter verbalized understanding and had no further questions at this  time.

## 2023-05-27 NOTE — Progress Notes (Unsigned)
   [facility] [address] [place], [place] [address] Phone:  [phone]    Please arrive 15 minutes prior to your appointment time for registration and insurance purposes.  The test will take approximately 3 to 4 hours to complete; you may bring reading material.  If someone comes with you to your appointment, they will need to remain in the main lobby due to limited space in the testing area. **If you are pregnant or breastfeeding, please notify the nuclear lab prior to your appointment**  How to prepare for your Myocardial Perfusion Test: Do not eat or drink 3 hours prior to your test, except you may have water. Do not consume products containing caffeine (regular or decaffeinated) 12 hours prior to your test. (ex: coffee, chocolate, sodas, tea). Do bring a list of your current medications with you.  If not listed below, you may take your medications as normal. Do wear comfortable clothes (no dresses or overalls) and walking shoes, tennis shoes preferred (No heels or open toe shoes are allowed). Do NOT wear cologne, perfume, aftershave, or lotions (deodorant is allowed). If these instructions are not followed, your test will have to be rescheduled.  Please report to [address] for your test.  If you have questions or concerns about your appointment, you can call the [facility] Lab at [phone].  If you cannot keep your appointment, please provide 24 hours notification to the Nuclear Lab, to avoid a possible $50 charge to your account.

## 2023-05-28 ENCOUNTER — Encounter (HOSPITAL_COMMUNITY): Payer: Self-pay | Admitting: *Deleted

## 2023-05-28 ENCOUNTER — Telehealth (HOSPITAL_COMMUNITY): Payer: Self-pay | Admitting: *Deleted

## 2023-05-28 NOTE — Telephone Encounter (Signed)
 Patient's grandaughter given detailed instructions per Myocardial Perfusion Study Information Sheet for the test on 06/03/2023 at 8:15. Patient notified to arrive 15 minutes early and that it is imperative to arrive on time for appointment to keep from having the test rescheduled.  If you need to cancel or reschedule your appointment, please call the office within 24 hours of your appointment. . Patient's grandaughter verbalized understanding.Anne Barrios

## 2023-06-03 ENCOUNTER — Ambulatory Visit: Attending: Cardiology

## 2023-06-03 DIAGNOSIS — R079 Chest pain, unspecified: Secondary | ICD-10-CM | POA: Diagnosis not present

## 2023-06-03 MED ORDER — TECHNETIUM TC 99M TETROFOSMIN IV KIT
10.1000 | PACK | Freq: Once | INTRAVENOUS | Status: AC | PRN
  Administered 2023-06-03: 10.1 via INTRAVENOUS

## 2023-06-03 MED ORDER — TECHNETIUM TC 99M TETROFOSMIN IV KIT
31.0000 | PACK | Freq: Once | INTRAVENOUS | Status: AC | PRN
  Administered 2023-06-03: 31 via INTRAVENOUS

## 2023-06-03 MED ORDER — REGADENOSON 0.4 MG/5ML IV SOLN
0.4000 mg | Freq: Once | INTRAVENOUS | Status: AC
  Administered 2023-06-03: 0.4 mg via INTRAVENOUS

## 2023-06-03 MED ORDER — AMINOPHYLLINE 25 MG/ML IV SOLN
75.0000 mg | Freq: Once | INTRAVENOUS | Status: AC
  Administered 2023-06-03: 75 mg via INTRAVENOUS

## 2023-06-04 DIAGNOSIS — I10 Essential (primary) hypertension: Secondary | ICD-10-CM | POA: Diagnosis not present

## 2023-06-04 DIAGNOSIS — R42 Dizziness and giddiness: Secondary | ICD-10-CM | POA: Diagnosis not present

## 2023-06-04 DIAGNOSIS — R9431 Abnormal electrocardiogram [ECG] [EKG]: Secondary | ICD-10-CM | POA: Diagnosis not present

## 2023-06-05 ENCOUNTER — Ambulatory Visit (HOSPITAL_BASED_OUTPATIENT_CLINIC_OR_DEPARTMENT_OTHER): Payer: Self-pay | Admitting: Family

## 2023-06-05 DIAGNOSIS — I10 Essential (primary) hypertension: Secondary | ICD-10-CM | POA: Diagnosis not present

## 2023-06-05 DIAGNOSIS — R9431 Abnormal electrocardiogram [ECG] [EKG]: Secondary | ICD-10-CM | POA: Diagnosis not present

## 2023-06-05 DIAGNOSIS — R42 Dizziness and giddiness: Secondary | ICD-10-CM | POA: Diagnosis not present

## 2023-06-05 LAB — MYOCARDIAL PERFUSION IMAGING
LV dias vol: 63 mL (ref 46–106)
LV sys vol: 15 mL
Nuc Stress EF: 76 %
Peak HR: 106 {beats}/min
Rest HR: 58 {beats}/min
Rest Nuclear Isotope Dose: 10.1 mCi
SDS: 1
SRS: 0
SSS: 1
ST Depression (mm): 0 mm
Stress Nuclear Isotope Dose: 31 mCi
TID: 1.04

## 2023-06-09 ENCOUNTER — Other Ambulatory Visit (HOSPITAL_COMMUNITY)

## 2023-06-09 DIAGNOSIS — Z79899 Other long term (current) drug therapy: Secondary | ICD-10-CM | POA: Diagnosis not present

## 2023-06-09 DIAGNOSIS — R112 Nausea with vomiting, unspecified: Secondary | ICD-10-CM | POA: Diagnosis not present

## 2023-06-09 DIAGNOSIS — R109 Unspecified abdominal pain: Secondary | ICD-10-CM | POA: Diagnosis not present

## 2023-06-09 DIAGNOSIS — K5730 Diverticulosis of large intestine without perforation or abscess without bleeding: Secondary | ICD-10-CM | POA: Diagnosis not present

## 2023-06-09 DIAGNOSIS — R1030 Lower abdominal pain, unspecified: Secondary | ICD-10-CM | POA: Diagnosis not present

## 2023-06-09 DIAGNOSIS — Z6828 Body mass index (BMI) 28.0-28.9, adult: Secondary | ICD-10-CM | POA: Diagnosis not present

## 2023-06-09 DIAGNOSIS — I517 Cardiomegaly: Secondary | ICD-10-CM | POA: Diagnosis not present

## 2023-06-09 DIAGNOSIS — I10 Essential (primary) hypertension: Secondary | ICD-10-CM | POA: Diagnosis not present

## 2023-06-11 DIAGNOSIS — I739 Peripheral vascular disease, unspecified: Secondary | ICD-10-CM | POA: Diagnosis not present

## 2023-06-11 DIAGNOSIS — R269 Unspecified abnormalities of gait and mobility: Secondary | ICD-10-CM | POA: Diagnosis not present

## 2023-06-11 DIAGNOSIS — Y999 Unspecified external cause status: Secondary | ICD-10-CM | POA: Diagnosis not present

## 2023-06-11 DIAGNOSIS — E785 Hyperlipidemia, unspecified: Secondary | ICD-10-CM | POA: Diagnosis not present

## 2023-06-11 DIAGNOSIS — M47816 Spondylosis without myelopathy or radiculopathy, lumbar region: Secondary | ICD-10-CM | POA: Diagnosis not present

## 2023-06-11 DIAGNOSIS — Z823 Family history of stroke: Secondary | ICD-10-CM | POA: Diagnosis not present

## 2023-06-11 DIAGNOSIS — Z9181 History of falling: Secondary | ICD-10-CM | POA: Diagnosis not present

## 2023-06-11 DIAGNOSIS — N1832 Chronic kidney disease, stage 3b: Secondary | ICD-10-CM | POA: Diagnosis not present

## 2023-06-11 DIAGNOSIS — R531 Weakness: Secondary | ICD-10-CM | POA: Diagnosis not present

## 2023-06-11 DIAGNOSIS — R9431 Abnormal electrocardiogram [ECG] [EKG]: Secondary | ICD-10-CM | POA: Diagnosis not present

## 2023-06-11 DIAGNOSIS — G309 Alzheimer's disease, unspecified: Secondary | ICD-10-CM | POA: Diagnosis not present

## 2023-06-11 DIAGNOSIS — Z008 Encounter for other general examination: Secondary | ICD-10-CM | POA: Diagnosis not present

## 2023-06-11 DIAGNOSIS — S32010A Wedge compression fracture of first lumbar vertebra, initial encounter for closed fracture: Secondary | ICD-10-CM | POA: Diagnosis not present

## 2023-06-11 DIAGNOSIS — Z833 Family history of diabetes mellitus: Secondary | ICD-10-CM | POA: Diagnosis not present

## 2023-06-11 DIAGNOSIS — R3915 Urgency of urination: Secondary | ICD-10-CM | POA: Diagnosis not present

## 2023-06-11 DIAGNOSIS — M1990 Unspecified osteoarthritis, unspecified site: Secondary | ICD-10-CM | POA: Diagnosis not present

## 2023-06-11 DIAGNOSIS — Z8249 Family history of ischemic heart disease and other diseases of the circulatory system: Secondary | ICD-10-CM | POA: Diagnosis not present

## 2023-06-11 DIAGNOSIS — M810 Age-related osteoporosis without current pathological fracture: Secondary | ICD-10-CM | POA: Diagnosis not present

## 2023-06-11 DIAGNOSIS — I129 Hypertensive chronic kidney disease with stage 1 through stage 4 chronic kidney disease, or unspecified chronic kidney disease: Secondary | ICD-10-CM | POA: Diagnosis not present

## 2023-06-11 DIAGNOSIS — R0989 Other specified symptoms and signs involving the circulatory and respiratory systems: Secondary | ICD-10-CM | POA: Diagnosis not present

## 2023-07-01 DIAGNOSIS — I739 Peripheral vascular disease, unspecified: Secondary | ICD-10-CM | POA: Diagnosis not present

## 2023-07-01 DIAGNOSIS — E785 Hyperlipidemia, unspecified: Secondary | ICD-10-CM | POA: Diagnosis not present

## 2023-07-01 DIAGNOSIS — G4733 Obstructive sleep apnea (adult) (pediatric): Secondary | ICD-10-CM | POA: Diagnosis not present

## 2023-07-01 DIAGNOSIS — I129 Hypertensive chronic kidney disease with stage 1 through stage 4 chronic kidney disease, or unspecified chronic kidney disease: Secondary | ICD-10-CM | POA: Diagnosis not present

## 2023-07-01 DIAGNOSIS — N1832 Chronic kidney disease, stage 3b: Secondary | ICD-10-CM | POA: Diagnosis not present

## 2023-07-02 ENCOUNTER — Other Ambulatory Visit: Payer: Self-pay | Admitting: Nephrology

## 2023-07-02 DIAGNOSIS — N1832 Chronic kidney disease, stage 3b: Secondary | ICD-10-CM

## 2023-07-03 ENCOUNTER — Encounter: Payer: Self-pay | Admitting: Nephrology

## 2023-07-04 ENCOUNTER — Ambulatory Visit
Admission: RE | Admit: 2023-07-04 | Discharge: 2023-07-04 | Disposition: A | Discharge status: Home / Self Care | Source: Ambulatory Visit | Attending: Nephrology | Admitting: Nephrology

## 2023-07-04 DIAGNOSIS — N189 Chronic kidney disease, unspecified: Secondary | ICD-10-CM | POA: Diagnosis not present

## 2023-07-04 DIAGNOSIS — N1832 Chronic kidney disease, stage 3b: Secondary | ICD-10-CM

## 2023-07-07 ENCOUNTER — Ambulatory Visit: Admitting: Cardiology

## 2023-07-28 DIAGNOSIS — Z6829 Body mass index (BMI) 29.0-29.9, adult: Secondary | ICD-10-CM | POA: Diagnosis not present

## 2023-07-28 DIAGNOSIS — B372 Candidiasis of skin and nail: Secondary | ICD-10-CM | POA: Diagnosis not present

## 2024-01-26 ENCOUNTER — Other Ambulatory Visit: Payer: Self-pay

## 2024-01-26 MED ORDER — HYDRALAZINE HCL 10 MG PO TABS: 10.0000 mg | ORAL_TABLET | Freq: Two times a day (BID) | ORAL | 0 refills | Status: DC

## 2024-02-11 ENCOUNTER — Telehealth: Payer: Self-pay | Admitting: *Deleted

## 2024-02-11 MED ORDER — HYDRALAZINE HCL 10 MG PO TABS: 10.0000 mg | ORAL_TABLET | Freq: Two times a day (BID) | ORAL | 0 refills | Status: AC

## 2024-02-11 NOTE — Telephone Encounter (Signed)
 Rx refill sent to pharmacy.
